# Patient Record
Sex: Female | Born: 1935 | Race: White | Hispanic: No | Smoking: Never smoker
Health system: Southern US, Community
[De-identification: ages and names within clinical notes are randomized; demographics above are authoritative.]

## PROBLEM LIST (undated history)

## (undated) DIAGNOSIS — Z789 Other specified health status: Secondary | ICD-10-CM

## (undated) DIAGNOSIS — E042 Nontoxic multinodular goiter: Secondary | ICD-10-CM

## (undated) DIAGNOSIS — E785 Hyperlipidemia, unspecified: Secondary | ICD-10-CM

## (undated) DIAGNOSIS — N189 Chronic kidney disease, unspecified: Secondary | ICD-10-CM

## (undated) DIAGNOSIS — K649 Unspecified hemorrhoids: Secondary | ICD-10-CM

## (undated) DIAGNOSIS — B029 Zoster without complications: Secondary | ICD-10-CM

## (undated) DIAGNOSIS — S6990XA Unspecified injury of unspecified wrist, hand and finger(s), initial encounter: Secondary | ICD-10-CM

## (undated) DIAGNOSIS — R0602 Shortness of breath: Secondary | ICD-10-CM

## (undated) DIAGNOSIS — E041 Nontoxic single thyroid nodule: Secondary | ICD-10-CM

## (undated) DIAGNOSIS — I2510 Atherosclerotic heart disease of native coronary artery without angina pectoris: Secondary | ICD-10-CM

## (undated) DIAGNOSIS — Z8619 Personal history of other infectious and parasitic diseases: Secondary | ICD-10-CM

## (undated) DIAGNOSIS — Z01810 Encounter for preprocedural cardiovascular examination: Secondary | ICD-10-CM

## (undated) DIAGNOSIS — R58 Hemorrhage, not elsewhere classified: Secondary | ICD-10-CM

## (undated) DIAGNOSIS — C801 Malignant (primary) neoplasm, unspecified: Secondary | ICD-10-CM

## (undated) DIAGNOSIS — I10 Essential (primary) hypertension: Secondary | ICD-10-CM

## (undated) DIAGNOSIS — S4990XA Unspecified injury of shoulder and upper arm, unspecified arm, initial encounter: Secondary | ICD-10-CM

## (undated) DIAGNOSIS — R001 Bradycardia, unspecified: Secondary | ICD-10-CM

## (undated) HISTORY — DX: Nontoxic multinodular goiter: E04.2

## (undated) HISTORY — DX: Chronic kidney disease, unspecified: N18.9

## (undated) HISTORY — DX: Shortness of breath: R06.02

## (undated) HISTORY — DX: Nontoxic single thyroid nodule: E04.1

## (undated) HISTORY — DX: Encounter for preprocedural cardiovascular examination: Z01.810

## (undated) HISTORY — DX: Essential (primary) hypertension: I10

## (undated) HISTORY — DX: Hyperlipidemia, unspecified: E78.5

## (undated) HISTORY — DX: Malignant (primary) neoplasm, unspecified: C80.1

## (undated) HISTORY — DX: Atherosclerotic heart disease of native coronary artery without angina pectoris: I25.10

## (undated) HISTORY — DX: Bradycardia, unspecified: R00.1

## (undated) HISTORY — PX: OTHER SURGICAL HISTORY: SHX169

## (undated) HISTORY — DX: Personal history of other infectious and parasitic diseases: Z86.19

## (undated) HISTORY — DX: Hemorrhage, not elsewhere classified: R58

## (undated) HISTORY — DX: Other specified health status: Z78.9

---
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
## 1972-03-20 HISTORY — PX: ABDOMINAL HYSTERECTOMY: SHX81

## 1979-03-21 DIAGNOSIS — K649 Unspecified hemorrhoids: Secondary | ICD-10-CM

## 1979-03-21 HISTORY — DX: Unspecified hemorrhoids: K64.9

## 1993-03-20 HISTORY — PX: TIBIA FRACTURE SURGERY: SHX806

## 2002-08-22 ENCOUNTER — Inpatient Hospital Stay (HOSPITAL_COMMUNITY): Admission: EM | Admit: 2002-08-22 | Discharge: 2002-08-26 | Payer: Self-pay | Admitting: Vascular Surgery

## 2002-08-22 ENCOUNTER — Encounter: Payer: Self-pay | Admitting: Vascular Surgery

## 2002-08-23 ENCOUNTER — Encounter: Payer: Self-pay | Admitting: Vascular Surgery

## 2003-01-23 ENCOUNTER — Encounter: Payer: Self-pay | Admitting: Cardiology

## 2003-01-29 ENCOUNTER — Ambulatory Visit (HOSPITAL_COMMUNITY): Admission: RE | Admit: 2003-01-29 | Discharge: 2003-01-29 | Payer: Self-pay | Admitting: Gastroenterology

## 2003-02-06 ENCOUNTER — Ambulatory Visit (HOSPITAL_COMMUNITY): Admission: RE | Admit: 2003-02-06 | Discharge: 2003-02-06 | Payer: Self-pay | Admitting: Cardiology

## 2003-03-21 HISTORY — PX: WRIST FUSION WITH ILIAC CREST BONE GRAFT: SHX5682

## 2003-05-28 ENCOUNTER — Ambulatory Visit (HOSPITAL_BASED_OUTPATIENT_CLINIC_OR_DEPARTMENT_OTHER): Admission: RE | Admit: 2003-05-28 | Discharge: 2003-05-28 | Payer: Self-pay | Admitting: Orthopedic Surgery

## 2007-02-07 ENCOUNTER — Emergency Department (HOSPITAL_COMMUNITY): Admission: EM | Admit: 2007-02-07 | Discharge: 2007-02-07 | Payer: Self-pay | Admitting: Emergency Medicine

## 2007-03-05 ENCOUNTER — Emergency Department (HOSPITAL_COMMUNITY): Admission: EM | Admit: 2007-03-05 | Discharge: 2007-03-06 | Payer: Self-pay | Admitting: Emergency Medicine

## 2007-03-21 DIAGNOSIS — Z9861 Coronary angioplasty status: Secondary | ICD-10-CM

## 2007-03-21 DIAGNOSIS — I2510 Atherosclerotic heart disease of native coronary artery without angina pectoris: Secondary | ICD-10-CM

## 2007-03-21 HISTORY — DX: Atherosclerotic heart disease of native coronary artery without angina pectoris: I25.10

## 2007-04-03 ENCOUNTER — Inpatient Hospital Stay (HOSPITAL_COMMUNITY): Admission: EM | Admit: 2007-04-03 | Discharge: 2007-04-06 | Payer: Self-pay | Admitting: Emergency Medicine

## 2007-04-03 ENCOUNTER — Ambulatory Visit: Payer: Self-pay | Admitting: Cardiology

## 2007-04-05 ENCOUNTER — Encounter (INDEPENDENT_AMBULATORY_CARE_PROVIDER_SITE_OTHER): Payer: Self-pay | Admitting: Emergency Medicine

## 2008-04-23 ENCOUNTER — Ambulatory Visit: Payer: Self-pay | Admitting: Cardiology

## 2008-11-10 ENCOUNTER — Encounter: Payer: Self-pay | Admitting: Cardiology

## 2008-11-11 ENCOUNTER — Ambulatory Visit: Payer: Self-pay | Admitting: Cardiology

## 2009-05-21 ENCOUNTER — Telehealth: Payer: Self-pay | Admitting: Cardiology

## 2009-11-15 ENCOUNTER — Encounter: Payer: Self-pay | Admitting: Cardiology

## 2009-11-16 ENCOUNTER — Ambulatory Visit: Payer: Self-pay | Admitting: Cardiology

## 2010-04-09 ENCOUNTER — Encounter: Payer: Self-pay | Admitting: Gastroenterology

## 2010-04-11 ENCOUNTER — Telehealth: Payer: Self-pay | Admitting: Cardiology

## 2010-04-21 NOTE — Progress Notes (Signed)
Summary: question on meds  Medications Added METOPROLOL SUCCINATE 25 MG XR24H-TAB (METOPROLOL SUCCINATE) 1 tab per day       Phone Note Call from Patient Call back at [facility] Phone [phone]   Caller: Patient Reason for Call: Talk to Nurse Summary of Call: pt has question re METOPROLOL SUCCINATE 25 MG. Initial call taken by: Roe Coombs,  April 11, 2010 11:13 AM  Follow-up for Phone Call        Called patient back and she advised me that she increased her Metoprolol XR to 25mg  every day because her BP had increased. She is now getting a BP reading of 125/66 to 125/79. Will send in a refill to [facility].  Layne Benton, RN, BSN  April 11, 2010 11:39 AM     New/Updated Medications: METOPROLOL SUCCINATE 25 MG XR24H-TAB (METOPROLOL SUCCINATE) 1 tab per day Prescriptions: METOPROLOL SUCCINATE 25 MG XR24H-TAB (METOPROLOL SUCCINATE) 1 tab per day  #90 x 3   Entered by:   Layne Benton, RN, BSN   Authorized by:   Talitha Givens, MD, [facility]   Signed by:   Layne Benton, RN, BSN on 04/11/2010   Method used:   Electronically to        [facility] [address] (retail)       [address]       [place], [place]  [address]       Ph: [phone] or [phone]       Fax: [phone]   RxID:   [id number]   Appended Document: question on meds Good

## 2010-04-21 NOTE — Assessment & Plan Note (Signed)
Summary: fu 1 year/jt  Medications Added ASPIRIN 81 MG TBEC (ASPIRIN) Take 1 tablet by mouth two times a day METOPROLOL SUCCINATE 25 MG XR24H-TAB (METOPROLOL SUCCINATE) 1/2 tab once daily MAGNESIUM 300 MG CAPS (MAGNESIUM) 3 tabs daily VITAMIN D 2000 UNIT TABS (CHOLECALCIFEROL) Take 1 tablet by mouth once a day * ULTRA JUICE Take 1 tablet by mouth once a day CINNAMON 500 MG CAPS (CINNAMON) 2 tabs daily * OTC POTASSIUM Take 1 tablet by mouth once a day CO Q-10 150 MG CAPS (COENZYME Q10) Take 1 tablet by mouth once a day GARLIQUE 400 MG TBEC (GARLIC) Take 1 tablet by mouth once a day BENADRYL 25 MG CAPS (DIPHENHYDRAMINE HCL) as needed ALEVE 220 MG TABS (NAPROXEN SODIUM) as needed DICYCLOMINE HCL 10 MG CAPS (DICYCLOMINE HCL) as needed      Allergies Added:   Visit Type:  Follow-up Primary Provider:  Abner Greenspan, MD   History of Present Illness: The patient is seen for followup of coronary artery disease.  She feels great.  She is not having any chest pain.  She has no shortness of breath.  She has some eye surgery planned.  She is cleared for this.  Current Medications (verified): 1)  Aspirin 81 Mg Tbec (Aspirin) .... Take 1 Tablet By Mouth Two Times A Day 2)  Metoprolol Succinate 25 Mg Xr24h-Tab (Metoprolol Succinate) .... 1/2 Tab Once Daily 3)  Estropipate 0.75 Mg Tabs (Estropipate) .... M,w,f 4)  Multivitamins   Tabs (Multiple Vitamin) .... Take One Tablet By Mouth Once Daily. 5)  Glucosamine 500 Mg Caps (Glucosamine Sulfate) .... Uad 6)  Calcium 1500 Mg Tabs (Calcium Carbonate) .... Daily 7)  Magnesium 300 Mg Caps (Magnesium) .... 3 Tabs Daily 8)  Vitamin D 2000 Unit Tabs (Cholecalciferol) .... Take 1 Tablet By Mouth Once A Day 9)  Fish Oil   Oil (Fish Oil) .... Take One Tablet By Mouth Once Daily. 10)  Ultra Juice .... Take 1 Tablet By Mouth Once A Day 11)  Cinnamon 500 Mg Caps (Cinnamon) .... 2 Tabs Daily 12)  Otc Potassium .... Take 1 Tablet By Mouth Once A Day 13)  Co Q-10  150 Mg Caps (Coenzyme Q10) .... Take 1 Tablet By Mouth Once A Day 14)  Garlique 400 Mg Tbec (Garlic) .... Take 1 Tablet By Mouth Once A Day 15)  Benadryl 25 Mg Caps (Diphenhydramine Hcl) .... As Needed 16)  Aleve 220 Mg Tabs (Naproxen Sodium) .... As Needed 17)  Dicyclomine Hcl 10 Mg Caps (Dicyclomine Hcl) .... As Needed  Allergies (verified): 1)  ! * Synthroid 2)  ! * Levothyroxine 3)  ! Penicillin 4)  ! * Tetnus 5)  ! * Percdan 6)  ! Tetracycline 7)  ! * Oxycontin 8)  ! Vicodin 9)  ! * Codine 10)  ! Darvocet 11)  ! Keflex 12)  ! Cipro 13)  ! Cephalexin 14)  ! Clindamycin 15)  ! * Nitrofur 16)  ! Darvon 17)  ! * Serzone 18)  ! * Proocardia  Past History:  Past Medical History: CAD   DES to LAD.Marland KitchenMarland Kitchen1/2009  /   Stress Echo Claris Gower)  09/2007  EFnormal...no ischemia Thyroid   samll heterogenious nodules.Marland KitchenMarland KitchenOK Retroperitoneal bleed  (post cath 2004)  surgery Drug allergies Hypertension Dyslipidemia  Review of Systems       Patient denies fever, chills vomiting, sweats, rash, change in vision, change in hearing, chest pain, cough, nausea vomiting, urinary symptoms.  All of the systems are reviewed and  are negative.  Vital Signs:  Patient profile:   77 year old female Weight:      166 pounds Pulse rate:   50 / minute BP sitting:   138 / 76  (left arm)  Vitals Entered By: Meredith Staggers, RN (November 16, 2009 7:53 AM)  Physical Exam  General:  patient looks great. Eyes:  no xanthelasma. Neck:  no jugular venous distention. Lungs:  lungs her respiratory effort is nonlabored. Heart:  cardiac exam reveals an S1-S2.  No clicks or significant murmurs. Abdomen:  abdomen soft. Extremities:  no peripheral edema. Psych:  patient is oriented to person time and place.  Affect is normal.   Impression & Recommendations:  Problem # 1:  DYSLIPIDEMIA (ICD-272.4) Patient has not tolerated statins in the past.  At this time she prefers not to use one.  Problem # 2:  HYPERTENSION  (ICD-401.9)  Her updated medication list for this problem includes:    Aspirin 81 Mg Tbec (Aspirin) .Marland Kitchen... Take 1 tablet by mouth two times a day    Metoprolol Succinate 25 Mg Xr24h-tab (Metoprolol succinate) .Marland Kitchen... 1/2 tab once daily Blood pressure is controlled.  No change in therapy.  Problem # 3:  CAD (ICD-414.00)  Her updated medication list for this problem includes:    Aspirin 81 Mg Tbec (Aspirin) .Marland Kitchen... Take 1 tablet by mouth two times a day    Metoprolol Succinate 25 Mg Xr24h-tab (Metoprolol succinate) .Marland Kitchen... 1/2 tab once daily Coronary disease is stable.  EKG is done and reviewed by me.  Old ST changes.  No change in the past.  No testing needed.  Follow up one year.  Orders: EKG w/ Interpretation (93000)  Patient Instructions: 1)  Your physician wants you to follow-up in:  1 year.  You will receive a reminder letter in the mail two months in advance. If you don't receive a letter, please call our office to schedule the follow-up appointment.

## 2010-04-21 NOTE — Miscellaneous (Signed)
  Clinical Lists Changes  Problems: Removed problem of UNSPECIFIED HYPOTHYROIDISM (ICD-244.9) Added new problem of HYPOTHYROIDISM (ICD-244.9) Added new problem of HYPERTENSION (ICD-401.9) Added new problem of DYSLIPIDEMIA (ICD-272.4) Observations: Added new observation of PAST MED HX: CAD   DES to LAD.Marland KitchenMarland Kitchen1/2009  /   Stress Echo Claris Gower)  09/2007  EFnormal...no ischemia Thyroid   samll heterogenious nodules.Marland KitchenMarland KitchenOK Retroperitoneal bleed  (post cath 2004)  surgery Drug allergies Hypertension Dyslipidemia (11/15/2009 8:39) Added new observation of PRIMARY MD: Joetta Manners, MD (11/15/2009 8:39)       Past History:  Past Medical History: CAD   DES to LAD.Marland KitchenMarland Kitchen1/2009  /   Stress Echo Claris Gower)  09/2007  EFnormal...no ischemia Thyroid   samll heterogenious nodules.Marland KitchenMarland KitchenOK Retroperitoneal bleed  (post cath 2004)  surgery Drug allergies Hypertension Dyslipidemia

## 2010-04-21 NOTE — Progress Notes (Signed)
Summary: regarding toprol   Phone Note Call from Patient Call back at Home Phone [phone] Call back at [phone]   Caller: Patient Reason for Call: Talk to Nurse Details for Reason: Per pt calling toprol.  Initial call taken by: Lorne Skeens,  May 21, 2009 2:44 PM  Follow-up for Phone Call        spoke w/pt she has been taking metoprolol tartrate 25mg  1/2 tab two times a day for a long time and now when she got it refilled it is met. succ she wants to use what she has so it doesn't cost her so much, she will take met. succ. 25mg  1/2 tab daily until she runs out then she will call me back for new rx for met tart., if she notices feeling different or elevated BP or HR will call back Meredith Staggers, RN  May 21, 2009 3:28 PM

## 2010-05-19 DIAGNOSIS — R0602 Shortness of breath: Secondary | ICD-10-CM | POA: Insufficient documentation

## 2010-05-19 DIAGNOSIS — R001 Bradycardia, unspecified: Secondary | ICD-10-CM | POA: Insufficient documentation

## 2010-05-19 HISTORY — DX: Shortness of breath: R06.02

## 2010-05-19 HISTORY — DX: Bradycardia, unspecified: R00.1

## 2010-06-02 ENCOUNTER — Telehealth: Payer: Self-pay | Admitting: Cardiology

## 2010-06-06 ENCOUNTER — Other Ambulatory Visit: Payer: Self-pay | Admitting: Cardiology

## 2010-06-06 ENCOUNTER — Ambulatory Visit (INDEPENDENT_AMBULATORY_CARE_PROVIDER_SITE_OTHER): Payer: Medicare Other | Admitting: Cardiology

## 2010-06-06 ENCOUNTER — Encounter: Payer: Self-pay | Admitting: Cardiology

## 2010-06-06 ENCOUNTER — Ambulatory Visit (INDEPENDENT_AMBULATORY_CARE_PROVIDER_SITE_OTHER)
Admission: RE | Admit: 2010-06-06 | Discharge: 2010-06-06 | Disposition: A | Discharge status: Home / Self Care | Payer: Medicare Other | Source: Ambulatory Visit | Attending: Cardiology | Admitting: Cardiology

## 2010-06-06 DIAGNOSIS — I10 Essential (primary) hypertension: Secondary | ICD-10-CM

## 2010-06-06 DIAGNOSIS — R0602 Shortness of breath: Secondary | ICD-10-CM

## 2010-06-06 LAB — CBC WITH DIFFERENTIAL/PLATELET
Basophils Absolute: 0.1 10*3/uL (ref 0.0–0.1)
Basophils Relative: 1.3 % (ref 0.0–3.0)
Eosinophils Absolute: 0.8 10*3/uL — ABNORMAL HIGH (ref 0.0–0.7)
Eosinophils Relative: 9 % — ABNORMAL HIGH (ref 0.0–5.0)
HCT: 38.4 % (ref 36.0–46.0)
Hemoglobin: 13 g/dL (ref 12.0–15.0)
Lymphocytes Relative: 34.1 % (ref 12.0–46.0)
Lymphs Abs: 3 10*3/uL (ref 0.7–4.0)
MCHC: 33.8 g/dL (ref 30.0–36.0)
MCV: 100.6 fl — ABNORMAL HIGH (ref 78.0–100.0)
Monocytes Absolute: 0.7 10*3/uL (ref 0.1–1.0)
Monocytes Relative: 8.4 % (ref 3.0–12.0)
Neutro Abs: 4.1 10*3/uL (ref 1.4–7.7)
Neutrophils Relative %: 47.2 % (ref 43.0–77.0)
Platelets: 205 10*3/uL (ref 150.0–400.0)
RBC: 3.82 Mil/uL — ABNORMAL LOW (ref 3.87–5.11)
RDW: 13.1 % (ref 11.5–14.6)
WBC: 8.8 10*3/uL (ref 4.5–10.5)

## 2010-06-06 LAB — BASIC METABOLIC PANEL
BUN: 18 mg/dL (ref 6–23)
CO2: 27 mEq/L (ref 19–32)
Calcium: 9.4 mg/dL (ref 8.4–10.5)
Chloride: 101 mEq/L (ref 96–112)
Creatinine, Ser: 1.5 mg/dL — ABNORMAL HIGH (ref 0.4–1.2)
GFR: 36.33 mL/min — ABNORMAL LOW (ref >60.00)
Glucose, Bld: 91 mg/dL (ref 70–99)
Potassium: 4.6 mEq/L (ref 3.5–5.1)
Sodium: 137 mEq/L (ref 135–145)

## 2010-06-06 LAB — TSH: TSH: 3.24 u[IU]/mL (ref 0.35–5.50)

## 2010-06-07 NOTE — Progress Notes (Signed)
Summary: pt is SOB   Phone Note Call from Patient Call back at [phone]   Caller: Patient Reason for Call: Talk to Nurse, Talk to Doctor Summary of Call: pt is SOB and wants to come in to see Dr. Myrtis Ser on Monday or asap/lg Initial call taken by: Omer Jack,  June 02, 2010 12:15 PM  Follow-up for Phone Call        Since Nov pt has noticed increased SOB, cont. to get worse and has SOB w/any activity, yest. she had an episode of lightheadedness, BP today 127/69, no chest pain or discomfortant, she has found that she is having to stop half way through acitivities to rest and catch her breathe, appt sch for Mon 3/19 at 12 w/Dr Boykin Nearing, RN  June 02, 2010 2:11 PM

## 2010-06-08 ENCOUNTER — Telehealth: Payer: Self-pay | Admitting: Cardiology

## 2010-06-08 NOTE — Telephone Encounter (Signed)
Pt given lab and xray results

## 2010-06-13 ENCOUNTER — Encounter: Payer: Self-pay | Admitting: Cardiology

## 2010-06-13 ENCOUNTER — Telehealth: Payer: Self-pay | Admitting: Cardiology

## 2010-06-13 NOTE — Telephone Encounter (Signed)
LMTCB--NT 

## 2010-06-13 NOTE — Telephone Encounter (Signed)
Herbert Seta,  What is this all about?

## 2010-06-13 NOTE — Telephone Encounter (Signed)
Pt calling stating no longer SOB since change in BP med--would like to know if needs ECHO and stress test--nt

## 2010-06-14 ENCOUNTER — Telehealth: Payer: Self-pay | Admitting: Cardiology

## 2010-06-14 NOTE — Telephone Encounter (Signed)
Pt aware, appts cx'd she will c/b if syptoms return

## 2010-06-14 NOTE — Telephone Encounter (Signed)
It appears that her labs were good.  Her x-ray did not show any significant findings.   Since she is feeling much better it is okay to not proceed with the other testing.  I can plan to see her back in 6 months for followup

## 2010-06-14 NOTE — Telephone Encounter (Signed)
Spoke w/pt, documented in phone mess dated 3/26

## 2010-06-14 NOTE — Telephone Encounter (Signed)
Left message to call back  

## 2010-06-14 NOTE — Telephone Encounter (Signed)
We saw pt on 3/19 and she was having bradycardia and SOB so we stopped metoprolol and you ordered an echo and stress echo, she states now SOB is better wants to know if she should have testing or not

## 2010-06-16 ENCOUNTER — Other Ambulatory Visit (HOSPITAL_COMMUNITY): Payer: Medicare Other | Admitting: Radiology

## 2010-06-16 ENCOUNTER — Other Ambulatory Visit (HOSPITAL_COMMUNITY): Payer: Medicare Other

## 2010-06-16 NOTE — Assessment & Plan Note (Signed)
Summary: increased SOB/hms  Medications Added VITAMIN B COMPLEX-C   CAPS (B COMPLEX-C) once daily OCUVITE  TABS (MULTIPLE VITAMINS-MINERALS) once daily ALEVE 220 MG TABS (NAPROXEN SODIUM) as needed      Allergies Added:   Visit Type:  add on for shortness of breath Primary Provider:  Abner Greenspan, MD  CC:  CAD.  History of Present Illness: The patient has known or near artery disease.  She received a rug eluting stent in January, 2009.  She had a stress echo out of town in July, 2009.  There was no ischemia.  Over several months she has noted exertional shortness of breath.  There is no chest pain.  She has no PND or orthopnea.  She did have chest discomfort originally when her stent was placed.  She also has had some borderline presyncope.  She has not had syncope.  Current Medications (verified): 1)  Aspirin 81 Mg Tbec (Aspirin) .... Take 1 Tablet By Mouth Two Times A Day 2)  Metoprolol Succinate 25 Mg Xr24h-Tab (Metoprolol Succinate) .Marland Kitchen.. 1 Tab Per Day 3)  Estropipate 0.75 Mg Tabs (Estropipate) .... M,w,f 4)  Multivitamins   Tabs (Multiple Vitamin) .... Take One Tablet By Mouth Once Daily. 5)  Glucosamine 500 Mg Caps (Glucosamine Sulfate) .... Uad 6)  Calcium 1500 Mg Tabs (Calcium Carbonate) .... Daily 7)  Magnesium 300 Mg Caps (Magnesium) .... 3 Tabs Daily 8)  Vitamin D 2000 Unit Tabs (Cholecalciferol) .... Take 1 Tablet By Mouth Once A Day 9)  Fish Oil   Oil (Fish Oil) .... Take One Tablet By Mouth Once Daily. 10)  Ultra Juice .... Take 1 Tablet By Mouth Once A Day 11)  Otc Potassium .... Take 1 Tablet By Mouth Once A Day 12)  Co Q-10 150 Mg Caps (Coenzyme Q10) .... Take 1 Tablet By Mouth Once A Day 13)  Benadryl 25 Mg Caps (Diphenhydramine Hcl) .... As Needed 14)  Aleve 220 Mg Tabs (Naproxen Sodium) .... As Needed 15)  Dicyclomine Hcl 10 Mg Caps (Dicyclomine Hcl) .... As Needed 16)  Vitamin B Complex-C   Caps (B Complex-C) .... Once Daily 17)  Ocuvite  Tabs (Multiple  Vitamins-Minerals) .... Once Daily 18)  Aleve 220 Mg Tabs (Naproxen Sodium) .... As Needed  Allergies (verified): 1)  ! * Synthroid 2)  ! * Levothyroxine 3)  ! Penicillin 4)  ! * Tetnus 5)  ! * Percdan 6)  ! Tetracycline 7)  ! * Oxycontin 8)  ! Vicodin 9)  ! * Codine 10)  ! Darvocet 11)  ! Keflex 12)  ! Cipro 13)  ! Cephalexin 14)  ! Clindamycin 15)  ! * Nitrofur 16)  ! Darvon 17)  ! * Serzone 18)  ! * Proocardia  Past History:  Past Medical History: CAD   DES to LAD.Marland KitchenMarland Kitchen1/2009  /   Stress Echo Claris Gower)  09/2007  EFnormal...no ischemia Thyroid   samll heterogenious nodules.Marland KitchenMarland KitchenOK Retroperitoneal bleed  (post cath 2004)  surgery Drug allergies Hypertension Dyslipidemia Bradycardia    March, 2012 Shortness of breath    March, 2012  Review of Systems       Patient denies fever, chills, headache, sweats, rash, change in vision, change in hearing, chest pain, cough, nausea vomiting, urinary symptoms.  All other systems are reviewed and are negative.  Vital Signs:  Patient profile:   77 year old female Height:      64 inches Weight:      167 pounds BMI:  28.77 Pulse rate:   65 / minute BP sitting:   150 / 78  (left arm) Cuff size:   regular  Vitals Entered By: Hardin Negus, RMA (June 06, 2010 12:23 PM)  Physical Exam  General:  patient looked stable today.  She is overweight. Head:  head is atraumatic. Eyes:  no xanthelasma. Neck:  no jugular venous extension. Chest Wall:  no chest wall tenderness. Lungs:  lungs are clear.  Respiratory effort is nonlabored. Heart:  cardiac exam reveals S1 and S2.  No clicks or significant murmurs. Abdomen:  abdomen is soft. Msk:  no musculoskeletal deformities Extremities:  no peripheral edema. Skin:  no skin rash. Psych:  patient is oriented to person time and place.  Affect is normal.   Impression & Recommendations:  Problem # 1:  BRADYCARDIA (ICD-427.89) The patient's heart rate today is 48.  It is possible that  bradycardia is playing a role with her symptoms.  She is on a small dose of metoprolol that will be stopped.  Problem # 2:  DYSLIPIDEMIA (ICD-272.4) The patient has not tolerated statins in the past and prefers not to use one.  Problem # 3:  HYPERTENSION (ICD-401.9)  The following medications were removed from the medication list:    Metoprolol Succinate 25 Mg Xr24h-tab (Metoprolol succinate) .Marland Kitchen... 1 tab per day Her updated medication list for this problem includes:    Aspirin 81 Mg Tbec (Aspirin) .Marland Kitchen... Take 1 tablet by mouth two times a day Blood pressure is slightly elevated today.  No change in therapy until we understand her symptoms better.  Problem # 4:  CAD (ICD-414.00)  The following medications were removed from the medication list:    Metoprolol Succinate 25 Mg Xr24h-tab (Metoprolol succinate) .Marland Kitchen... 1 tab per day Her updated medication list for this problem includes:    Aspirin 81 Mg Tbec (Aspirin) .Marland Kitchen... Take 1 tablet by mouth two times a day The patient does have coronary disease.  EKG is done today and reviewed by me.  There is no QRS changes.  She has old ST changes.  There is marked bradycardia.  It is possible that her shortness of breath he is an anginal equivalent but I'm not convinced.  We will need to rule this out.  Stress echo will be done.  Problem # 5:  SHORTNESS OF BREATH (ICD-786.05)  This is the major complaint today and it is new.  She is not volume overloaded.  It is possible that this could be from ischemia.  With her bradycardia however she may have chronotropic incompetence.  We also need to see if she had any lab work.  She will have a CBC, chemistry lab, TSH, chest x-ray, and a stress echo with her walking on the treadmill.  It will be important to document her O2 sats when she walks and to document her heart rate response.  We will be able to obtain ECHO data at the same time to see if there's been a change in her LV function.  This will be done with standard  echo.  Orders: TLB-BMP (Basic Metabolic Panel-BMET) (80048-METABOL) TLB-CBC Platelet - w/Differential (85025-CBCD) TLB-TSH (Thyroid Stimulating Hormone) (84443-TSH) Echocardiogram (Echo) Stress Echo (Stress Echo) T-2 View CXR (71020TC)  Patient Instructions: 1)  Stop your Metoprolol for now' 2)  Lab today 3)  A chest x-ray takes a picture of the organs and structures inside the chest, including the heart, lungs, and blood vessels. This test can show several things, including, whether the heart  is enlarged; whether fluid is building up in the lungs; and whether pacemaker / defibrillator leads are still in place. 4)  Your physician has requested that you have a stress echocardiogram. For further information please visit [email or web address]  Please follow instruction sheet as given. 5)  Your physician has requested that you have an echocardiogram.  Echocardiography is a painless test that uses sound waves to create images of your heart. It provides your doctor with information about the size and shape of your heart and how well your heart's chambers and valves are working.  This procedure takes approximately one hour. There are no restrictions for this procedure. 6)  Follow up in 2-3 weeks

## 2010-06-20 ENCOUNTER — Ambulatory Visit: Payer: Medicare Other | Admitting: Cardiology

## 2010-07-19 DIAGNOSIS — B029 Zoster without complications: Secondary | ICD-10-CM

## 2010-07-19 HISTORY — DX: Zoster without complications: B02.9

## 2010-08-02 NOTE — Discharge Summary (Signed)
NAMEADALYNN, CORNE                ACCOUNT NO.:  [id number]   MEDICAL RECORD NO.:  [id number]          PATIENT TYPE:  INP   LOCATION:  6532                         FACILITY:  [facility]   PHYSICIAN:  Elliot Cousin, M.D.    DATE OF BIRTH:  02-18-1928   DATE OF ADMISSION:  04/03/2007  DATE OF DISCHARGE:  04/06/2007                               DISCHARGE SUMMARY   DISCHARGE DIAGNOSES:  1. Chest pain/angina.  Myocardial infarction ruled out.  2. Coronary artery disease.  The cardiac catheterization as performed      by Dr. Tonny Bollman revealed single-vessel CAD with moderately      severe mid LAD stenosis; diffuse nonobstructive left circumflex and      right coronary artery stenosis; normal left ventricular filling      pressures; successful PCI of the LAD with an Endeavor drug-eluting      stent on April 05, 2007.  3. Hypertension.  4. Acute-on-chronic kidney disease.  5. Hyperlipidemia.   DISCHARGE MEDICATIONS:  1. Aspirin 325 mg daily.  2. Plavix 75 mg daily.  3. Sublingual nitroglycerin as needed.  4. Lipitor 20 mg q.h.s.  5. Metoprolol 25 mg 1/2 a tablet b.i.d.  6. Hydrochlorothiazide 25 mg 1/2 a tablet b.i.d.  7. Bentyl as previously prescribed.  8. Lisinopril 10 mg daily.  9. Potassium chloride 20 mEq 1/2 a tablet daily.  10.Estrogen, currently being weaned by the patient's gynecologist.  11.Multivitamin once daily.  12.Calcium once daily.   DISCHARGE DISPOSITION:  The patient was discharged home in improved and  stable condition on April 06, 2007.  She was advised to follow up with  her cardiologist, Dr. Myrtis Ser, in 1-2 weeks and with her primary care  physician in 1-2 weeks.   CONSULTATIONS:  [facility], including Dr. Diona Browner, Dr. Excell Seltzer,  and Dr. Daleen Squibb.   PROCEDURES PERFORMED:  1. Cardiac catheterization on April 05, 2007.  The results are      above.  2. A 2-D echocardiogram on April 05, 2007.  The results revealed an      ejection fraction of 55%  to 65%.  No left ventricular regional wall      motion abnormalities.  Mild AR, mildly dilated left atrium, mildly      dilated right atrium.  3. Chest x-ray on April 03, 2007.  The results revealed no active      disease.   HISTORY OF PRESENT ILLNESS:  The patient is a 77 year old woman with a  past medical history significant for coronary artery disease,  hypertension, and diverticulitis.  She presented to the emergency  department on April 03, 2007, with a chief complaint of substernal  chest pressure.  Her signs and symptoms were worrisome for angina.  She  was therefore admitted for further evaluation and management.   For additional details, please see the dictated history and physical.   HOSPITAL COURSE:  1. ANGINA/CORONARY ARTERY DISEASE.  At the time of the initial      assessment, the patient was hemodynamically stable.  Her EKG      revealed sinus bradycardia with heart  rate of 51 beats per minute      and nonspecific ST/T-wave abnormalities.  Her initial cardiac      markers were negative.  The patient was started on treatment      empirically with full- dose Lovenox and nitroglycerin..  In      addition, she was started on Lopressor and hydrochlorothiazide for      additional blood pressure control.  Aspirin at 325 mg was continued      daily.  For further evaluation, cardiac enzymes, a fasting lipid      panel, and a 2-D echocardiogram were ordered.  The cardiac enzymes      were negative.  Her fasting lipid panel revealed a total      cholesterol of 205, triglycerides of 94, HDL cholesterol of 36, and      LDL cholesterol of 150.  She was started on Lipitor 20 mg daily.      The 2-D echocardiogram revealed preserved LV function with an      ejection fraction of 55% to 65%.  Her TSH was assessed as well and      was marginally elevated at 7.4.   Cardiologist, Dr. Diona Browner, was consulted.  Per his evaluation, the  patient needed a cardiac catheterization.  The  cardiac catheterization  was performed on April 05, 2007, by Dr. Excell Seltzer.  In essence, the  cardiac catheterization revealed severe mid LAD  stenosis.  The patient  underwent successful PCI of the LAD followed by a drug-eluting stent.  The patient was subsequently started on Plavix.   Over the course of the hospitalization, the patient became chest pain  free.  The nitroglycerin and Lovenox were eventually discontinued.  The  patient was advised to continue medical management as recommended.  She  did voice concern about taking Lipitor as she has had some intolerances  to statin drugs in the past.  The patient states that she probably will  not take Lipitor and will continue lifestyle changes.  In addition, the  patient was advised to discontinue estrogen therapy.  She stated that  her gynecologist is currently weaning her off of estrogen.   1. ACUTE ON CHRONIC KIDNEY DISEASE.  The lisinopril was temporarily      withheld during the initial part of the hospitalization.  The      patient's creatinine was 1.4 on admission and 1.3 prior to hospital      discharge.  She was advised to resume lisinopril following hospital      discharge.   1. MILDLY ELEVATED TSH.  The patient will need to have a free T4      ordered in the outpatient setting.  This will be deferred to Dr.      Myrtis Ser or her primary care physician.      Elliot Cousin, M.D.  Electronically Signed     DF/MEDQ  D:  04/06/2007  T:  04/06/2007  Job:  [id number]   cc:   Luis Abed, MD, [facility]  Aundra Dubin. Revankar, M.D.  Huel Cote, M.D.  [facility]

## 2010-08-02 NOTE — Consult Note (Signed)
[name], [name] NO.:  [id number]   MEDICAL RECORD NO.:  [id number]          PATIENT TYPE:  INP   LOCATION:  4711                         FACILITY:  [facility]   PHYSICIAN:  Jonelle Sidle, MD DATE OF BIRTH:  01/16/1928   DATE OF CONSULTATION:  DATE OF DISCHARGE:                                 CONSULTATION   PRIMARY CARDIOLOGIST:  Luis Abed, MD, [facility].   REQUESTING SERVICE:  [facility] E Team.   REASON FOR CONSULTATION:  Chest tightness and abnormal  electrocardiogram.   HISTORY OF PRESENT ILLNESS:  Vanessa [name] is a pleasant 77 year old woman  with a history of hypertension that was fairly recently diagnosed back  in November 2008, possible diverticulitis recently treated with Cipro  and with plans for a colonoscopy by Dr. Charm Barges next week due to a mild  degree of hematochezia noted on tissue and a family history of colon  cancer and no clear history of obstructive cardiovascular disease.  Available information indicates that she underwent a cardiac  catheterization by Dr. Tomie China in [place] back in June 2004, following  an abnormal myocardial perfusion study.  This study reportedly revealed  no obstructive coronary artery disease, although she ultimately  developed a significant retroperitoneal bleed and was transferred  urgently to [facility] for intervention by Dr. Hart Rochester.  The  patient recuperated well.  She saw Dr. Myrtis Ser in 2004, although has had no  followup cardiac evaluation since that time.  She was referred for a  ventilation/perfusion lung scan and had no clear evidence of  thromboembolic disease at that time.   More recently over the last few months, she has been experiencing some  abdominal pain, predominantly in the left lower quadrant and was seen at  [facility] in November at which time an abdominal and pelvic  CT scan was performed.  This revealed no acute abdominal findings and  evidence of prior hysterectomy  with post surgical changes in the right  inguinal region.  She has been recently on Cipro for presumed  diverticulitis and is to undergo further gastroenterology evaluation as  already documented.   She is presently admitted to the hospital having developed a feeling of  chest heaviness when she woke up in the morning yesterday.  These  symptoms waxed and waned and the patient states that they were at least  moderate in intensity, moving up into her chest from the epigastric area  initially.  She was worried that she may be having a heart  attack,although she was also concerned that she may have been having an  allergic reaction to the Cipro.  She presented to the hospital for  further assessment.  At the present time she is comfortable, having been  treated with both morphine and intravenous nitroglycerin.  She notes  improvement in her symptoms after starting on nitroglycerin.  Her  electrocardiogram is abnormal but relatively nonspecific showing  nonspecific ST-T wave changes.  Cardiac markers have been normal with  troponin I level of 0.02, down to less than 0.01 and normal CK-MB  levels.  She does  have an elevated LDL cholesterol of 150.  A chest x-  ray demonstrated no active cardiopulmonary disease.  She was tentatively  scheduled for a followup Myoview and also echocardiogram by the  [facility] team although the patient refuses to have this done, citing a  significant amount of discharge and problems with the previous Myoview  she had in 2004.  We have been asked to evaluate her now.   ALLERGIES:  The patient reports an allergy to:  1. PENICILLIN.  2. PERCODAN.  3. TETRACYCLINE.  4. VICODIN.  5. OXYCODONE.  6. Possibly a reaction to Cipro although this is not well documented.   PRESENT MEDICATIONS:  1. Aspirin 325 mg p.o. daily.  2. Bentyl 20 mg p.o. q.i.d.  3. Lovenox 70 mg subcutaneously q.12 h.  4. Hydrochlorothiazide 25 mg p.o. daily.  5. Lopressor 12.5 mg p.o.  b.i.d.  6. Protonix 40 mg p.o. daily.  7. Senokot 1 tablet q.h.s.  8. Zocor 20 mg p.o. q.h.s.  9. Ventolin.  10.Atrovent.  11.Nitroglycerin drip.  12.Zofran p.r.n.  13.Phenergan p.r.n.   PAST MEDICAL HISTORY:  Outlined above.  The patient reports that  generally she has been fairly healthy despite these issues.  She has a  previous history of appendectomy, tonsillectomy, hysterectomy, and  hemorrhoidectomy.  She has had surgery on her left leg, forearm, and  wrist as well.   SOCIAL HISTORY:  The patient is a retired Engineer, water,  previously worked at [employer].  Denies any  tobacco or alcohol use.  She lives in [place].   FAMILY HISTORY:  Reviewed.  The patient states that her mother died at  age 72 with a myocardial infarction.  Father died of old age.  No  siblings with premature cardiovascular disease.   REVIEW OF SYSTEMS:  As described in the history of present illness.  She  has no typical exertional chest pain.  She has had some lightheadedness  recently.  No frank claudication.  She does have occasional reflux  symptoms.  Otherwise systems are negative.   PHYSICAL EXAMINATION:  VITAL SIGNS:  Temperature is 97.7 degrees, heart  rate 58, respirations 18, blood pressure 133/58, oxygen saturation is  98% on room air.  GENERAL:  This is a well nourished woman, comfortable and in no acute  distress.  HEENT:  Conjunctivae and lids are normal.  Pharynx is clear.  NECK:  Supple.  No elevated jugular venous pressure .  No loud bruits.  No thyromegaly.  LUNGS:  Clear without labored breathing.  CARDIAC:  Reveals a regular rate and rhythm.  No loud murmur or gallop.  ABDOMEN;  Soft, nontender.  No guarding or rebound.  Bowel sounds are  present.  No obvious hepatomegaly is noted.  EXTREMITIES:  Exhibit no significant pitting edema.  Distal pulses are 2  plus.  SKIN:  Warm and dry.  MUSCULOSKELETAL:  No kyphosis noted.  NEUROPSYCHIATRIC:  The  patient is alert and oriented x3.  Affect is  normal.   LABORATORY DATA:  WBC 8.9, hemoglobin 12.6, hematocrit 37, platelet  count 225.  INR is 0.9.  D-dimer 0.35.  Sodium 138, potassium 3.8,  chloride 108, bicarb 27, glucose 102, BUN 16, creatinine 1.3.  Urinalysis normal.   IMPRESSION:  1. Recent onset chest heaviness at rest.  Responsive ultimately to      nitroglycerin and morphine and associated with normal cardiac      markers.  Electrocardiogram is abnormal but nonspecific.  This is  on a baseline of hyperlipidemia and recently diagnosed      hypertension, although previously documented evidence of no major      obstructive cardiovascular disease by cardiac catheterization in      2004.  The patient denies any interval followup ischemic testing.      Initially a Myoview was ordered by the admitting service, although      the patient does not want to proceed with this citing problems with      previous stress testing and significant apprehension about this.  2. Reportedly minor hematochezia noted recently with a normal      hemoglobin of 12.6 and a normal MCV of 98.  The patient is being      followed by Dr. Charm Barges and there were actually plans underway for a      colonoscopy next week given a family history of colon cancer.  The      patient has also had some left lower quadrant pain and possibly      diverticulitis.  She had an abdominal and pelvic CT scan in      November demonstrating no acute findings.  3. Previous history of large intraperitoneal bleed requiring surgical      intervention by Dr. Hart Rochester following a cardiac catheterization done      in [place] by Dr. Tomie China in 2004.  The patient has recovered      well without any obvious sequelae.   RECOMMENDATIONS:  I had a lengthy discussion with the patient.  She has  had recent resting chest heaviness concerning for potential unstable  angina, although her cardiac markers are reassuring and her   electrocardiogram is nonspecifically abnormal.  She has not undergone  any ischemic evaluation since 2004 and does have risk factors including  post menopausal state, hyperlipidemia, and recently diagnosed  hypertension.  In light of this and the fact that she is in need of a  followup colonoscopy as discussed above, I do think that a followup  cardiac evaluation is warranted.  I discussed this with the patient and  we reviewed the options of either treating her for presumptive  progression in cardiovascular disease with medical therapy alone,  proceeding onto a noninvasive Myoview, or proceed on to a repeat  diagnostic cardiac catheterization, realizing the potential risks and  benefits including her prior history of retroperitoneal hematoma.  After  carefully considering the matter, the patient does not want to proceed  with observation alone and also does not want to proceed with a Myoview  given significant apprehension about this.  In point of fact, she is  much more inclined to undergo a followup diagnostic cardiac  catheterization.  We reviewed the potential risks and benefits and this  will be scheduled for tomorrow.  She has normal bilateral femoral pulses  with scar on the right following her previous surgery.  Both groins will  be prepped.  In the meanwhile, would suggest continuing aspirin,  Lovenox, nitroglycerin, beta-blocker therapy, and Statin therapy.  Further plans to follow.      Jonelle Sidle, MD  Electronically Signed     SGM/MEDQ  D:  04/04/2007  T:  04/04/2007  Job:  [id number]   cc:   Luis Abed, MD, [facility]

## 2010-08-02 NOTE — Cardiovascular Report (Signed)
NAMETAKARA, Vanessa [name]                ACCOUNT NO.:  [id number]   MEDICAL RECORD NO.:  [id number]          PATIENT TYPE:  INP   LOCATION:  6532                         FACILITY:  [facility]   PHYSICIAN:  Veverly Fells. Excell Seltzer, MD  DATE OF BIRTH:  1927/08/09   DATE OF PROCEDURE:  04/05/2007  DATE OF DISCHARGE:                            CARDIAC CATHETERIZATION   PROCEDURE:  Left heart catheterization, selective coronary angiography  and stenting of the LAD.   INDICATIONS:  [name] is a 77 year old woman who presented with  substernal chest pain.  She has known previous CAD, but she has been  managed medically, as she has only had mild-to-moderate stenosis by  report.  She presented with substernal chest pressure in the setting of  multiple risk factors.  She was referred for cardiac catheterization for  further evaluation.   Risks and indications of the procedure were reviewed with the patient,  and informed consent was obtained.  The left groin was prepped, draped,  and anesthetized with 1% lidocaine.  Using a modified Seldinger  technique, a 6-French sheath was placed in the left femoral artery.  Standard 6-French Judkins catheters were used to image the coronary  arteries.  The JR-4 catheter was used to cross the aortic valve and  perform pullback.   At the completion of the diagnostic procedure I elected to intervene on  the LAD.  There was a focal stenosis of 75-80% in the midportion of the  LAD with a hazy appearance.  A CLS 3.5 cm guide catheter was used.  Angiomax was used for anticoagulation.  The patient was preloaded with  600 mg of clopidogrel on the table.  A cougar guidewire was passed  beyond the area of disease into the distal LAD.  Once a therapeutic ACT  was achieved, the lesion was primarily stented with a 2.5 x 12-mm  endeavor stent which was deployed a 12 atmospheres.  The stent was well  expanded.  Following stenting, I elected to post dilate with a 2.5 x 9-  mm   sprinter noncompliant balloon.  This was taken to 15 atmospheres.   At the completion of procedure the stent was well expanded.  There is  TIMI 3 flow throughout the vessel.  There was a step-down from the  distal end of the stent, but it was not associated with significant  stenosis.  Following stenting, the patient developed chest and arm  pressure.  I carefully reviewed the films, and there were no side  branches that were compromised.  There was TIMI 3 flow throughout the  vessel.  It is possible that she had microembolization.  She required  0.5 mg of atropine for a vagal reaction.  With fluids and atropine, she  became hemodynamically stable within a matter of just a few minutes.   FINDINGS:  1. Left ventricular pressure 124/8, aortic pressure 123/53 with a mean      of 81.  2. The left mainstem has mild diffuse disease creating no more than      20% stenosis.  The left main bifurcates into the LAD and  left      circumflex.  3. The LAD courses down the anterior wall and reaches the LV apex.      The proximal portion of the LAD has minor plaque.  The midportion      around the areas of the first and second septal perforators have      mild disease with approximately 30% stenosis.  There is a focal      area beyond the second diagonal branch with a 75% hazy appearing      stenosis.  Beyond that area, the vessel becomes rather small and      bifurcates into a diagonal and distal LAD.   The left circumflex is diffusely diseased.  There is nonobstructive  plaque throughout.  It supplies a tiny OM-1 and a moderate-sized OM-2  branch.  There are no focal stenoses present.  The tightest lesion is in  the range of 30-40%.   The right coronary artery has very minor disease.  It is a big vessel  that supplies the inferior wall.  There is diffuse plaque throughout the  proximal and midportions.  Distally it terminates in a PDA branch and  posterolateral branch.  Both branches are widely  patent with no  significant stenosis.   ASSESSMENT:  1. Single-vessel CAD with moderately severe mid-LAD stenosis.  2. Diffuse nonobstructive left circumflex and right coronary artery      stenosis.  3. Normal left ventricular filling pressures.  4. Successful PCI of the LAD with an endeavor drug-eluting stent.   RECOMMEND:  Indefinite aspirin with 12 months minimum of Plavix.  Ms.  Bradt will be watched carefully, but overall she is she is on currently  stable.      Veverly Fells. Excell Seltzer, MD  Electronically Signed     MDC/MEDQ  D:  04/05/2007  T:  04/05/2007  Job:  [id number]   cc:   Luis Abed, MD, [facility]  Huel Cote, M.D.  Webb Silversmith

## 2010-08-02 NOTE — H&P (Signed)
NAME:  Vanessa [name], Vanessa [name]                ACCOUNT NO.:  [id number]   MEDICAL RECORD NO.:  [id number]          PATIENT TYPE:  INP   LOCATION:  4711                         FACILITY:  [facility]   PHYSICIAN:  Mobolaji B. Bakare, M.D.DATE OF BIRTH:  03-25-1927   DATE OF ADMISSION:  04/03/2007  DATE OF DISCHARGE:                              HISTORY & PHYSICAL   PRIMARY CARE PHYSICIAN:  None.   CARDIOLOGIST:  Aundra Dubin. Revankar, MD--[place].   GYNECOLOGIST:  Huel Cote, MD   GASTROENTEROLOGIST:  Webb Silversmith, MD--[place].   CHIEF COMPLAINT:  Substernal chest pressure.   HISTORY OF PRESENT ILLNESS:  Vanessa [name] is a 77 year old Caucasian female  with history of hypertension.  She woke up this morning about 6 a.m.  having substernal chest pressure.  It was not associated with shortness  of breath, diaphoresis, nausea, or vomiting.  It was nonradiating.  The  patient went to Urgent Care in [place].  She tells me that her EKG was  said to be abnormal.  She was sent over to the emergency room here.  EKG  done today shows nonspecific T-wave abnormalities in inferolateral  leads.  I noted on EKG done on March 05, 2007,  ST segment  abnormality in the lateral leads.  This appears to have improved with  this current EKG.  She was seen on March 04, 2007 in the emergency  room with a chief complaint of elevated blood pressure and back pain,  and heart fluttering.   The patient has history of CAD.  She was evaluated with a cardiac  catheterization in 2004.  She stated that she was seen by a cardiologist  up in [name] at that time with some chest complaint.  She  underwent a stress test. On completion of the stress test she had left-  sided chest pain radiating to  her arm.  She was taken for an emergency  cardiac catheterization.  She stated that she was told she has a 30/70  blockage, but could not give further details.  This cardiac  catheterization was complicated by hematoma  and she was transferred over  to [name] for emergency surgery.   The patient has been quite active at home; has never had angina  symptoms.  She was recently diagnosed with hypertension in November 2008  and was started on lisinopril.   The patient was seen by gastroenterologist 3 days ago for left lower  quadrant pain and she was started on treatment for presumed  diverticulitis with ciprofloxacin and dicyclomine.  She is scheduled to  have colonoscopy next week, Tuesday.   REVIEW OF SYSTEMS:  She denies fever, cough, shortness of breath,  dyspnea on exertion, lower extremity edema, orthopnea, or PND.  She  denies dysuria or urgency.   PAST MEDICAL HISTORY:  1. Hypertension, recently diagnosed.  2. Coronary artery disease in 2004.  This was not really followed up.  3. On treatment for diverticulitis.   PAST SURGICAL HISTORY:  1. Appendectomy in 1949.  2. Tonsillectomy in 1956.  3. Hysterectomy in 1974.  4. Hemorrhoidectomy in 1981.  5.  Left leg surgery with rods in 1995 for broken bone.  6. Left forearm surgery in 2007 by Dr. Amanda Pea with removal of plates      in November 2008.  7. Left wrist and hand surgery in 2005.   ALLERGIES:  PENICILLIN causes a rash.  TETANUS causes a welt.  TETRACYCLINE causes a rash.  OXYCONTIN, VICODIN, TYLENOL WITH CODEINE,  NAPROXEN, she developed severe diarrhea to CLINDAMYCIN.  MEPERGAN,  KEFLEX, CEPHALEXIN causes swollen face and redness.  TRAMADOL causes  stomach pains.   FAMILY HISTORY:  Mother passed away from myocardial infarction at the  age of 43.  Father passed away from old age.  Two sisters died from  complications of asthma.  One sister died from colon cancer, and sister  passed away from lung cancer, she was a smoker.   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  She does not smoke  cigarettes, nor drink alcohol.   PHYSICAL EXAMINATION:  INITIAL  VITALS:  Temperature 97.9.  Blood  pressure 168/80.  Pulse of 70, respiratory rate of 18.  O2 sat of 95%.  On examination she is awake, alert, oriented in time, place, and person.  Normocephalic, atraumatic head.  Pupils are equal, round, and reactive  to light.  Extraocular muscle movement intact.  Mucous membranes moist.  No oral thrush.  No elevated JVD.  No carotid bruit.  LUNGS:  Clear clinically to auscultation.  CARDIOVASCULAR:  S1,  S2, regular.  ABDOMEN:  Not distended.  Soft.  Mild left lower quadrant tenderness  without rebound or guarding.  No palpable organomegaly.  CHEST WALL:  Minimal reproducible chest tenderness.  EXTREMITIES:  No pedal edema or calf tenderness.  Dorsalis pedis pulses  palpable bilaterally.  CNS:  No focal neurological deficits.   INITIAL LABORATORY DATA:  Cardiac makers at the point of care, myoglobin  82, troponin less than 0.05.  CK MB less than 1.  Phosphatidyl cardiac  marker was also normal.  PT/INR 12.5/0.9.  Creatinine 1.4, sodium 134,  potassium 4.1, chloride 108, glucose 101, BUN 20,  Hemoglobin 16.3, hematocrit 48, bicarb 27.  Chest x-ray shows no acute  cardiopulmonary disease.   ASSESSMENT AND PLAN:  1. Vanessa [name] is a 77 year old Caucasian female with history of      hypertension and coronary artery disease.  She is presenting with      substernal chest pressure.  She had an abnormal EKG on March 05, 2007.  Now with nonspecific ST abnormality on a current EKG.      Cardiac makers at the point of care normal.  Chest pressure is      responding to nitroglycerine infusion, started by emergency room      physician.  The patient will be admitted to telemetry floor and      rule out myocardial infarction.  Obtain a D-dimer, cardiac enzymes      and q.8 h. x3 sets, check fasting lipid profile and hemoglobin A1c.      Start Lopressor 12.5 mg p.o. b.i.d., aspirin 325 mg daily (she was      given aspirin in the emergency room).  Continue  nitroglycerin      infusion.  Start full dose Lovenox and will check stool hemoccults      daily for 3 days.  The patient will need at least a stress test if      she  rules out.  2. Hypertension.  Will start her on Lopressor and hydrochlorothiazide.      I am holding lisinopril in view of the elevated creatinine.  We may      be able to resume lisinopril if creatinine normalizes.  3. Acute on chronic kidney disease.  Check urinalysis, hold      lisinopril.  IV fluid normal saline at 60 mL per hour.  Will repeat      BMET in the morning.  4. Presumed diverticulitis.  Will continue ciprofloxacin 500 mg b.i.d.      and dicyclomine 20 mg p.o. q.i.d. p.r.n.  The patient has a      scheduled appointment for colonoscopy with Dr. Charm Barges on Tuesday,      April 17, 2007.  5. Will obtain record from [facility].      Mobolaji B. Corky Downs, M.D.  Electronically Signed     MBB/MEDQ  D:  04/03/2007  T:  04/04/2007  Job:  [id number]   cc:   Aundra Dubin. Revankar, M.D.  Huel Cote, M.D.  Webb Silversmith

## 2010-08-02 NOTE — Assessment & Plan Note (Signed)
[facility]                            CARDIOLOGY OFFICE NOTE   Vanessa [name], Vanessa [name]                       MRN:          [id number]  DATE:04/23/2008                            DOB:          1927-07-10    Ms. Vanessa [name] is here to reestablish cardiac care.  We had taken care for in  the past.  Also, she was admitted for cardiac evaluation in January  2009.  After that time, she moved to [place], [place] to be closer  to family, but now has moved back and wants to reestablish with her.  In  January, we actually saw her in the hospital, but not in the office.  In  the hospital, she had chest pain.  She had some nonspecific ST-T wave  changes.  Cardiac catheterization was done on April 05, 2007.  It  showed a severe mid LAD stenosis.  The patient received a drug-eluting  stent to that area and was started on Plavix.  Since that time, she has  recovered completely.  She has been followed by the cardiologist in  [place].  She has not had anymore of the chest discomfort that she had  previously.  Currently, she is not having any chest pain.  She is going  about full activities.   PAST MEDICAL HISTORY:   ALLERGIES:  PENICILLIN, PERCODAN, TETRACYCLINE, VICODIN, CIPRO, TETANUS,  OXYCODONE, CODEINE, TRAMADOL, DARVON, KEFLEX, CLINDAMYCIN,  NITROFURANTOIN, and a question of LEVOTHYROXINE.   MEDICATIONS:  Aspirin, vitamins, calcium, omega-3 fish oil, magnesium,  metoprolol, and estropipate.   OTHER MEDICAL PROBLEMS:  See the list below.   REVIEW OF SYSTEMS:  She is not having any GI or GU symptoms.  She has no  headaches.  There are no fevers or chills.  She has no skin rashes.  She  is wearing a brace on her right ankle.  She had a fractured ankle in  November and it continues to heal slowly, but it still has to be braced.  Otherwise, her review of systems is negative.   PHYSICAL EXAMINATION:  VITAL SIGNS:  Blood pressure is 175/83 with a  pulse of  52.  GENERAL:  The patient is oriented to person, time, and place.  Affect is  normal.  HEENT:  No xanthelasma.  She has normal extraocular motion.  NECK:  There are no carotid bruits.  There is no jugular venous  distention.  LUNGS:  Clear.  Respiratory effort is not labored.  CARDIAC:  S1 with an S2.  There are no clicks or significant murmurs.  ABDOMEN:  Soft.  EXTREMITIES:  She has no peripheral edema.  She has a brace on her right  ankle.   EKG reveals sinus bradycardia with nonspecific ST-T wave changes.  The  patient had a stress echo done in [place] on October 14, 2007.  She  exercised 5 minutes.  There were no significant EKG changes.  Wall  motion at rest was normal.  Wall motion immediately post stress revealed  no significant abnormalities.  She had LVH with a normal ejection  fraction.  There was  trace aortic regurgitation.  There was decreased  tolerance overall, but no evidence of ischemia.  The patient had a  thyroid scan on July 18, 2007, in [place].  It was felt that she had  a heterogeneous gland with a few small nodules.  I will be talking to  the patient about whether recommendation had been made for followup.  I  spoke to the patient.  She tells me that she did have testing and was  eventually told that her thyroid was okay.  This issue will have to be  kept in mind over time by a primary care physician when she establishes  with new primary care back here in [place].   PROBLEMS:  #1.  History of catheterization in 2004.  At that time, her  coronaries showed no major occlusion, but she had a large  retroperitoneal bleed that had to be repaired surgically.  #2.  Coronary artery disease with an intervention with a drug-eluting  stent in January 2009.  This was an Endeavor stent drug-eluting placed  to the mid circumflex.  Stress echo done in [place] in July showed no  ischemia.  #3.  Recent fractured ankle, which is improving.  She did not have   syncope.  #4.  Multiple drug allergies, as listed above.  #5.  Hypertension.  Blood pressure is elevated today.  She has run short  on her medicines.  These will be restarted.  She will be seen back for  blood pressure followup.  #6.  Status post appendectomy and hysterectomy.  #7.  Elevated cholesterol.  Currently, she is not listed on a statin.  We will discuss this with her.  Historically, she has not tolerated  medications well.   Cardiac status is stable.  We will give her medications.  We will see  her back to follow up the issues as outlined above.  I have spent a  prolonged period of time reviewing 30 pages of outpatient data and an  old chart and reviewing all of her current situation.     Luis Abed, MD, [facility]  Electronically Signed    JDK/MedQ  DD: 04/23/2008  DT: 04/24/2008  Job #: [id number]

## 2010-08-05 NOTE — Op Note (Signed)
NAME:  Vanessa [name], Vanessa [name]                          ACCOUNT NO.:  [id number]   MEDICAL RECORD NO.:  [id number]                   PATIENT TYPE:  AMB   LOCATION:  DSC                                  FACILITY:  [facility]   PHYSICIAN:  Dionne Ano. Everlene Other, M.D.         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   PROCEDURE:  1. Scaphotrapezoid trapezium fusion with distal radius bone graft and K-wire     fixation.  2. Attainment of bone graft from the left distal radius.  3. Placement of MIG synthetic bone graft in the distal radius.  4. Stress radiography.  5. Partial radial styloidectomy, left wrist.   SURGEON:  Dionne Ano. Amanda Pea, M.D.   ASSISTANT:  Karie Chimera, P.A.-C.   COMPLICATIONS:  None.   ANESTHESIA:  General LMA.   DRAINS:  None.   TOURNIQUET TIME:  Less than 90 minutes.   INDICATIONS FOR PROCEDURE:  This patient is a 77 year old female who  presents with the above mentioned diagnosis.  I have counseled her in  regards to the risks and benefits of surgery including the risks of  infection, bleeding, anesthesia, damage to normal structures, and failure of  surgery to accomplish its intended goals.  With this in mind, she desires to  proceed.  All questions were encouraged and answered preoperatively.   OPERATIVE FINDINGS:  The patient had severe arthritis.  She underwent STT  fusion as described by Claudette Laws with associated styloidectomy.  She tolerated  the procedure well without complicating features.   PROCEDURE IN DETAIL:  The patient was taken to the operating suite, permit  was signed, preoperative antibiotics were given  in the form of vancomycin.  She was prepped and draped in the usual sterile fashion after LMA general  anesthesia was induced.  Once under general anesthesia, the patient had the  arm elevated, the tourniquet was elevated to 250 mmHg.  The patient then  underwent a transverse incision just distal to the radial styloid dorsal  radially.  Crossing veins were protected.  The patient had the radial artery  identified and mobilized.  I then identified the STT joint, incised this  longitudinally, and following this prepared the trapezoid, trapezium, and  scaphoid with dental rongeur.  Once the cancellous surfaces were denuded  appropriately, I then preplaced two 0.045 K-wires in the trapezoid.  I then  placed an approximately 3-5 mm instrument in the area, reduced the scaphoid  appropriately, placed the wrist in 45 degrees of extension and in radial  deviation and pinned the scaphoid.  This produced the desired position as  described by Claudette Laws.  Following  this, I turned attention towards the distal  radius.  An incision was made, transverse in nature, dissection was carried  down between the ECRL and ECRB.  A pilot hole was made oval in nature and  cancellous bone graft was harvested to from the distal radius to my  satisfaction without difficulty.  The patient tolerated the procedure well,  there were no complicating features with this.  Once this was harvested,  this bone graft was taken and placed in the STT joint.  It was packed nicely  and checked under radiograph.  Final copy x-rays were made.  I then placed  MIG bone graft from [facility] which is a calcium sulfate mixture in the  void created.  Due to the fact that she is 77 years of age, I did not want  to leave this hollow and, thus, placed bone graft in the form of the calcium  sulfate in this defect.  The cortex was not encroached upon nor was there an  iatrogenic fracture.  Following this, I turned attention towards the radial   styloid.  This was exposed without difficulty taking care to protect the  superficial radial nerve.  At this point in time, I performed a partial  styloidectomy.  This was approximately 6-7 mm, taking care to avoid injury  to the radial scaphocapitate ligament.  She tolerated this well, this was  smooth, bone wax was placed against it, and following this, the tourniquet  was deflated.  Hemostasis was obtained with bipolar electrocautery as it was  drained throughout the course of the procedure.  The deep branch of the  radial artery was identified, once again, and was patent and looked quite  well.  I then copiously irrigated the wounds and closed them with  interrupted Prolene.  The patient then had a long arm cast with the thumb  placed in mobilization of desired fashion and the MCP joint mobilized in  flexion.  She tolerated this well, there were no complicating features, all  sponge, needle, and instrument counts were reported as correct.  We will  monitor her condition in the recovery room.  She will spend the night for IV  antibiotics, elevation, pain control, etc.  I have discussed with the  family.  Marcaine without epinephrine was placed in the wound for postop  analgesia.  A small amount of Gelfoam with epinephrine soaked Marcaine was  placed in the distal radial styloid area.  There were no complicating  features with her surgery.  We will monitor her closely and proceed  accordingly.                                               Dionne Ano. Everlene Other, M.D.    Nash Mantis  D:  05/28/2003  T:  05/29/2003  Job:  [id number]

## 2010-09-12 ENCOUNTER — Encounter: Payer: Self-pay | Admitting: Cardiology

## 2010-10-19 ENCOUNTER — Encounter: Payer: Self-pay | Admitting: Cardiology

## 2010-10-27 ENCOUNTER — Encounter: Payer: Self-pay | Admitting: Cardiology

## 2010-10-27 DIAGNOSIS — E042 Nontoxic multinodular goiter: Secondary | ICD-10-CM | POA: Insufficient documentation

## 2010-10-27 DIAGNOSIS — K683 Retroperitoneal hematoma: Secondary | ICD-10-CM | POA: Insufficient documentation

## 2010-10-27 DIAGNOSIS — I10 Essential (primary) hypertension: Secondary | ICD-10-CM | POA: Insufficient documentation

## 2010-10-27 DIAGNOSIS — E785 Hyperlipidemia, unspecified: Secondary | ICD-10-CM | POA: Insufficient documentation

## 2010-10-27 DIAGNOSIS — R58 Hemorrhage, not elsewhere classified: Secondary | ICD-10-CM | POA: Insufficient documentation

## 2010-10-28 ENCOUNTER — Encounter: Payer: Self-pay | Admitting: Cardiology

## 2010-10-28 ENCOUNTER — Ambulatory Visit (INDEPENDENT_AMBULATORY_CARE_PROVIDER_SITE_OTHER): Payer: Medicare Other | Admitting: Cardiology

## 2010-10-28 VITALS — BP 182/83 | HR 51 | Ht 64.0 in | Wt 155.0 lb

## 2010-10-28 DIAGNOSIS — I10 Essential (primary) hypertension: Secondary | ICD-10-CM

## 2010-10-28 DIAGNOSIS — R0602 Shortness of breath: Secondary | ICD-10-CM

## 2010-10-28 DIAGNOSIS — Z888 Allergy status to other drugs, medicaments and biological substances status: Secondary | ICD-10-CM

## 2010-10-28 DIAGNOSIS — I498 Other specified cardiac arrhythmias: Secondary | ICD-10-CM

## 2010-10-28 DIAGNOSIS — R001 Bradycardia, unspecified: Secondary | ICD-10-CM

## 2010-10-28 DIAGNOSIS — I2510 Atherosclerotic heart disease of native coronary artery without angina pectoris: Secondary | ICD-10-CM

## 2010-10-28 DIAGNOSIS — Z789 Other specified health status: Secondary | ICD-10-CM

## 2010-10-28 DIAGNOSIS — E785 Hyperlipidemia, unspecified: Secondary | ICD-10-CM

## 2010-10-28 NOTE — Assessment & Plan Note (Signed)
Heart rate today is 51.  She is not having symptoms.  No change in therapy.

## 2010-10-28 NOTE — Assessment & Plan Note (Signed)
Systolic blood pressure is elevated today.  She takes her blood pressure regularly at home and her systolic is never above 130.  I believe there is a white coat component to her blood pressure today.  She is anxious.  No further workup.

## 2010-10-28 NOTE — Patient Instructions (Signed)
Your physician recommends that you schedule a follow-up appointment in: 12 months, the office will mail you a reminder letter 2 months prior appointment date. Your physician recommends that you continue on your current medications as directed. Please refer to the Current Medication list given to you today. 

## 2010-10-28 NOTE — Assessment & Plan Note (Signed)
The patient's coronary disease is stable.  She is on aspirin.  She is not having any significant symptoms.  Her EKG is unchanged.

## 2010-10-28 NOTE — Progress Notes (Signed)
HPI The patient is seen for cardiology followup.  I had seen her in March, 2012.  At that time she had shortness of breath.  Laboratory studies were good and her chest x-ray showed no marked abnormality.  I had recommended a stress echo.  The patient felt better within a few days and asked if it was okay not to proceed with the echo.  The study was not done.  Since that time she's done well.  As part of today's evaluation I have reviewed the prior record and updated it to the current EMR.  I have also reviewed several years of EKGs.  The patient does not have any significant chest pain or shortness of breath. Allergies  Allergen Reactions  . Cephalexin   . Ciprofloxacin   . Clindamycin   . Codeine   . Hydrocodone-Acetaminophen   . Levothyroxine Sodium   . Oxycodone Hcl   . Penicillins   . Propoxyphene Hcl   . Propoxyphene N-Acetaminophen   . Tetanus Toxoid   . Tetracycline     Current Outpatient Prescriptions  Medication Sig Dispense Refill  . aspirin 81 MG tablet Take 81 mg by mouth 2 (two) times daily.        . B Complex-C (B-COMPLEX WITH VITAMIN C) tablet Take 1 tablet by mouth daily.        . calcium citrate-vitamin D (CITRACAL+D) 315-200 MG-UNIT per tablet Take 1 tablet by mouth daily.        . Cholecalciferol (VITAMIN D) 2000 UNITS CAPS Take 1 capsule by mouth daily.        Marland Kitchen dicyclomine (BENTYL) 10 MG capsule Take 10 mg by mouth as needed.        . diphenhydrAMINE (BENADRYL) 25 MG tablet as needed.        Marland Kitchen estropipate (OGEN) 0.75 MG tablet Take 0.75 mg by mouth. M, W, F       . Fish Oil OIL Take 1 tablet by mouth daily.        . Flaxseed, Linseed, (FLAX SEED OIL PO) Take by mouth daily.        . Glucosamine 500 MG CAPS Take by mouth. UAD       . Magnesium 300 MG CAPS Take 3 capsules by mouth daily.        . Multiple Vitamin (MULTIVITAMIN) capsule Take 1 capsule by mouth daily.        . naproxen sodium (ANAPROX) 220 MG tablet as needed.        Marland Kitchen POTASSIUM PO Take 1 tablet  by mouth daily.          History   Social History  . Marital Status: Widowed    Spouse Name: N/A    Number of Children: N/A  . Years of Education: N/A   Occupational History  . Retired    Social History Main Topics  . Smoking status: Never Smoker   . Smokeless tobacco: Not on file  . Alcohol Use: No  . Drug Use: No  . Sexually Active: Not on file   Other Topics Concern  . Not on file   Social History Narrative  . No narrative on file    Family History  Problem Relation Age of Onset  . Cancer    . Coronary artery disease      Past Medical History  Diagnosis Date  . CAD (coronary artery disease) 03/2007    DES to LAD, January, 2009 / stress echo, July, 2009 elsewhere, no ischemia  .  Multiple thyroid nodules     small heterogenious nodules...OK  . Retroperitoneal bleed     post cath 2004-surgery  . HTN (hypertension)   . Dyslipidemia   . Bradycardia 05/2010    March, 2012  . SOB (shortness of breath) 05/2010    March, 2012    No past surgical history on file.  ROS  Patient denies fever, chills, headache, sweats, rash, change in vision, change in hearing, chest pain, cough, nausea vomiting, urinary symptoms.  All other systems are reviewed and are negative.  PHYSICAL EXAM Patient is oriented to person time and place.  Affect is normal.  Head is atraumatic.  There is no xanthelasma.  Lungs are clear.  Respiratory effort is unlabored.  Cardiac exam reveals an S1-S2.  No clicks or significant murmurs.  The abdomen is soft there is no peripheral edema.  There are no musculoskeletal deformities.  There are no skin rashes. Filed Vitals:   10/28/10 0906  BP: 182/83  Pulse: 51  Height: 5\' 4"  (1.626 m)  Weight: 155 lb (70.308 kg)    EKG Is done today and reviewed by me.  I've also compared to several tracings from the past.  Past EKG for comparison was from August of 2011.  The patient has inverted T waves in leads 2, 3 aVF and V3 to V6. This is  unchanged.  ASSESSMENT & PLAN

## 2010-10-28 NOTE — Assessment & Plan Note (Signed)
The patient's lipids are being managed by her primary physician.

## 2010-10-28 NOTE — Assessment & Plan Note (Signed)
Patient has statin intolerance from the past.  This is why she is not currently on one.

## 2010-10-28 NOTE — Assessment & Plan Note (Signed)
She is not having any shortness of breath.  No further workup.  I will see her for cardiology followup in one year.

## 2010-12-08 LAB — BASIC METABOLIC PANEL
CO2: 29
Calcium: 9.3
Chloride: 108
Creatinine, Ser: 1.33 — ABNORMAL HIGH
GFR calc Af Amer: 47 — ABNORMAL LOW
GFR calc Af Amer: 48 — ABNORMAL LOW
GFR calc non Af Amer: 39 — ABNORMAL LOW
Potassium: 3.8
Potassium: 3.8
Sodium: 138
Sodium: 140

## 2010-12-08 LAB — CBC
HCT: 37.8
HCT: 38.6
Hemoglobin: 12.6
Hemoglobin: 13.2
MCHC: 34.4
MCV: 99
MCV: 98.2
Platelets: 236
RBC: 3.82 — ABNORMAL LOW
RBC: 3.77 — ABNORMAL LOW
RBC: 3.9
WBC: 9.8
WBC: 8.9
WBC: 9.4

## 2010-12-08 LAB — URINALYSIS, ROUTINE W REFLEX MICROSCOPIC
Hgb urine dipstick: NEGATIVE
Nitrite: NEGATIVE
Specific Gravity, Urine: 1.011
Urobilinogen, UA: 0.2

## 2010-12-08 LAB — CARDIAC PANEL(CRET KIN+CKTOT+MB+TROPI)
CK, MB: 0.9
CK, MB: 1.2
CK, MB: 1.6
Relative Index: INVALID
Relative Index: INVALID
Total CK: 44
Total CK: 49
Troponin I: 0.01
Troponin I: 0.02
Troponin I: 0.03
Troponin I: 0.29 — ABNORMAL HIGH

## 2010-12-08 LAB — DIFFERENTIAL
Basophils Relative: 1
Lymphocytes Relative: 41
Lymphs Abs: 4
Monocytes Relative: 10
Neutro Abs: 3.7
Neutrophils Relative %: 38 — ABNORMAL LOW

## 2010-12-08 LAB — I-STAT 8, (EC8 V) (CONVERTED LAB)
Glucose, Bld: 101 — ABNORMAL HIGH
HCT: 48 — ABNORMAL HIGH
Hemoglobin: 16.3 — ABNORMAL HIGH
Operator id: 257131
Potassium: 4.1
Sodium: 138
TCO2: 28

## 2010-12-08 LAB — POCT I-STAT CREATININE: Operator id: 257131

## 2010-12-08 LAB — LIPID PANEL
Cholesterol: 205 — ABNORMAL HIGH
LDL Cholesterol: 150 — ABNORMAL HIGH
Total CHOL/HDL Ratio: 5.7

## 2010-12-08 LAB — COMPREHENSIVE METABOLIC PANEL
AST: 15
Albumin: 3.4 — ABNORMAL LOW
Alkaline Phosphatase: 59
Chloride: 105
GFR calc Af Amer: 45 — ABNORMAL LOW
Potassium: 3.9
Total Bilirubin: 0.7

## 2010-12-08 LAB — HEMOGLOBIN A1C: Hgb A1c MFr Bld: 5.8

## 2010-12-08 LAB — POCT CARDIAC MARKERS
CKMB, poc: 1.7
Myoglobin, poc: 86.1
Operator id: 257131

## 2010-12-08 LAB — TROPONIN I: Troponin I: 0.02

## 2010-12-23 LAB — POCT CARDIAC MARKERS
CKMB, poc: 1.1
Myoglobin, poc: 89.9
Operator id: 196461
Troponin i, poc: <0.05

## 2010-12-23 LAB — COMPREHENSIVE METABOLIC PANEL
Albumin: 3.6
Alkaline Phosphatase: 61
BUN: 16
Calcium: 9.8
Potassium: 3.6
Total Protein: 6.6

## 2010-12-23 LAB — CBC
HCT: 37.9
MCHC: 34.4
Platelets: 236
RDW: 13.2

## 2010-12-23 LAB — DIFFERENTIAL
Lymphocytes Relative: 45
Lymphs Abs: 4
Monocytes Absolute: 0.9
Monocytes Relative: 10
Neutro Abs: 3.5

## 2010-12-23 LAB — MAGNESIUM: Magnesium: 2.6 — ABNORMAL HIGH

## 2010-12-27 LAB — CBC
HCT: 39.8
Hemoglobin: 13.9
MCHC: 35
MCV: 96.3
Platelets: 276
RBC: 4.13
RDW: 12.9
WBC: 9.5

## 2010-12-27 LAB — URINALYSIS, ROUTINE W REFLEX MICROSCOPIC
Bilirubin Urine: NEGATIVE
Glucose, UA: NEGATIVE
Hgb urine dipstick: NEGATIVE
Ketones, ur: NEGATIVE
Nitrite: NEGATIVE
Protein, ur: NEGATIVE
Specific Gravity, Urine: 1.009
Urobilinogen, UA: 0.2
pH: 7

## 2010-12-27 LAB — COMPREHENSIVE METABOLIC PANEL
AST: 19
Albumin: 3.7
Alkaline Phosphatase: 66
Chloride: 103
GFR calc Af Amer: 41 — ABNORMAL LOW
Potassium: 4.1
Total Bilirubin: 0.7
Total Protein: 6.8

## 2010-12-27 LAB — DIFFERENTIAL
Basophils Absolute: 0.1
Basophils Relative: 1
Eosinophils Absolute: 0.4
Eosinophils Relative: 5
Lymphocytes Relative: 22
Lymphs Abs: 2.1
Monocytes Absolute: 0.7
Monocytes Relative: 7
Neutro Abs: 6.3
Neutrophils Relative %: 66

## 2010-12-27 LAB — COMPREHENSIVE METABOLIC PANEL WITH GFR
ALT: 17
BUN: 22
CO2: 28
Calcium: 9.8
Creatinine, Ser: 1.5 — ABNORMAL HIGH
GFR calc non Af Amer: 34 — ABNORMAL LOW
Glucose, Bld: 108 — ABNORMAL HIGH
Sodium: 141

## 2010-12-27 LAB — LIPASE, BLOOD: Lipase: 16

## 2011-03-21 DIAGNOSIS — S4990XA Unspecified injury of shoulder and upper arm, unspecified arm, initial encounter: Secondary | ICD-10-CM

## 2011-03-21 HISTORY — DX: Unspecified injury of shoulder and upper arm, unspecified arm, initial encounter: S49.90XA

## 2011-11-13 ENCOUNTER — Ambulatory Visit: Payer: Medicare Other | Admitting: Cardiology

## 2011-12-19 ENCOUNTER — Ambulatory Visit (INDEPENDENT_AMBULATORY_CARE_PROVIDER_SITE_OTHER): Payer: Medicare Other | Admitting: Cardiology

## 2011-12-19 ENCOUNTER — Encounter: Payer: Self-pay | Admitting: Cardiology

## 2011-12-19 VITALS — BP 138/90 | HR 50 | Ht 64.0 in | Wt 159.0 lb

## 2011-12-19 DIAGNOSIS — E785 Hyperlipidemia, unspecified: Secondary | ICD-10-CM

## 2011-12-19 DIAGNOSIS — R001 Bradycardia, unspecified: Secondary | ICD-10-CM

## 2011-12-19 DIAGNOSIS — Z01810 Encounter for preprocedural cardiovascular examination: Secondary | ICD-10-CM

## 2011-12-19 DIAGNOSIS — I498 Other specified cardiac arrhythmias: Secondary | ICD-10-CM

## 2011-12-19 DIAGNOSIS — I2510 Atherosclerotic heart disease of native coronary artery without angina pectoris: Secondary | ICD-10-CM

## 2011-12-19 DIAGNOSIS — I10 Essential (primary) hypertension: Secondary | ICD-10-CM

## 2011-12-19 LAB — BASIC METABOLIC PANEL
BUN: 26 mg/dL — ABNORMAL HIGH (ref 6–23)
CO2: 28 mEq/L (ref 19–32)
GFR: 39.59 mL/min — ABNORMAL LOW (ref >60.00)
Glucose, Bld: 94 mg/dL (ref 70–99)
Potassium: 4.1 mEq/L (ref 3.5–5.1)
Sodium: 138 mEq/L (ref 135–145)

## 2011-12-19 NOTE — Progress Notes (Signed)
HPI  Patient is seen for followup of coronary disease. She's also seen for preop clearance for shoulder surgery. The patient has known coronary disease. She's done very well. She received a drug-eluting stent January, 2009. Eventually she went off dual antiplatelet therapy. She's not having any chest pain or shortness of breath. She is fully active. She needs to have shoulder surgery.  She also mentions that she's been told that taking more potassium would be good for her. I explained that we should check her potassium and make sure that she is not taking too much over-the-counter potassium  Allergies  Allergen Reactions  . Cephalexin   . Ciprofloxacin   . Clindamycin   . Codeine   . Hydrocodone-Acetaminophen   . Levothyroxine Sodium   . Oxycodone Hcl   . Penicillins   . Propoxyphene Hcl   . Propoxyphene-Acetaminophen   . Tetanus Toxoid   . Tetracycline     Current Outpatient Prescriptions  Medication Sig Dispense Refill  . aspirin 81 MG tablet Take 81 mg by mouth 2 (two) times daily.        . B Complex-C (B-COMPLEX WITH VITAMIN C) tablet Take 1 tablet by mouth daily.        . calcium citrate-vitamin D (CITRACAL+D) 315-200 MG-UNIT per tablet Take 1 tablet by mouth daily.        . Cholecalciferol (VITAMIN D) 2000 UNITS CAPS Take 1 capsule by mouth daily.        Marland Kitchen dicyclomine (BENTYL) 10 MG capsule Take 10 mg by mouth as needed.        . diphenhydrAMINE (BENADRYL) 25 MG tablet as needed.        Marland Kitchen estropipate (OGEN) 0.75 MG tablet Take 0.75 mg by mouth. M, W, F       . Fish Oil OIL Take 1 tablet by mouth daily.        . Flaxseed, Linseed, (FLAX SEED OIL PO) Take by mouth daily.        . Glucosamine 500 MG CAPS Take by mouth. UAD       . Magnesium 300 MG CAPS Take 3 capsules by mouth daily.        . Multiple Vitamin (MULTIVITAMIN) capsule Take 1 capsule by mouth daily.        . naproxen sodium (ANAPROX) 220 MG tablet as needed.        Marland Kitchen POTASSIUM PO Take 1 tablet by mouth daily.           History   Social History  . Marital Status: Widowed    Spouse Name: N/A    Number of Children: N/A  . Years of Education: N/A   Occupational History  . Retired    Social History Main Topics  . Smoking status: Never Smoker   . Smokeless tobacco: Not on file  . Alcohol Use: No  . Drug Use: No  . Sexually Active: Not on file   Other Topics Concern  . Not on file   Social History Narrative  . No narrative on file    Family History  Problem Relation Age of Onset  . Cancer    . Coronary artery disease      Past Medical History  Diagnosis Date  . CAD (coronary artery disease) 03/2007    DES to LAD, January, 2009 / stress echo, July, 2009 elsewhere, no ischemia  . Multiple thyroid nodules     small heterogenious nodules...OK  . Retroperitoneal bleed  post cath 2004-surgery  . HTN (hypertension)   . Dyslipidemia   . Bradycardia 05/2010    March, 2012  . SOB (shortness of breath) 05/2010    March, 2012  . Statin intolerance   . Preop cardiovascular exam     Cardiac clearance for shoulder surgery October, 2013    No past surgical history on file.  ROS  Patient denies fever, chills, headache, sweats, rash, change in vision, change in hearing, chest pain, cough, nausea vomiting, urinary symptoms. All other systems are reviewed and are negative.  PHYSICAL EXAM  Patient is oriented to person time and place. Affect is normal. There is no jugulovenous distention. Lungs are clear. Respiratory effort is nonlabored. Cardiac exam reveals S1-S2. There no clicks or significant murmurs. The abdomen is soft. There is no peripheral edema. There no musculoskeletal deformities. There no skin rashes. I did not fully examine her shoulder.  Filed Vitals:   12/19/11 1054  BP: 138/90  Pulse: 50  Height: 5\' 4"  (1.626 m)  Weight: 159 lb (72.122 kg)   EKG is done today and reviewed by me. She has old sinus bradycardia. She has old diffuse nonspecific ST-T wave changes. There is  no change.  ASSESSMENT & PLAN

## 2011-12-19 NOTE — Assessment & Plan Note (Signed)
Blood pressures controlled. No change in therapy. 

## 2011-12-19 NOTE — Assessment & Plan Note (Addendum)
The patient is quite stable. Her last exercise test was 2009. She is fully active. She's not having any significant symptoms. She has no chest pain or signs of heart failure. She has not had any significant arrhythmias. She is stable and cleared for her shoulder surgery.  The patient tells me that she is taking over the counter potassium. I suspect that this is a small dose. However I made it quite clear to her that she needs to not take an excessive amount. In addition chemistry will be checked today to be sure that her potassium is okay.

## 2011-12-19 NOTE — Assessment & Plan Note (Signed)
Patient is statin intolerant. No change in therapy. 

## 2011-12-19 NOTE — Assessment & Plan Note (Signed)
Patient has chronic sinus bradycardia. She has not had any significant symptoms. No further workup is needed.

## 2011-12-19 NOTE — Patient Instructions (Addendum)
Your physician recommends that you return for lab work in: today  Your physician recommends that you continue on your current medications as directed. Please refer to the Current Medication list given to you today. Please do not take any more OTC Potassium than you are at this time.  Your physician wants you to follow-up in: 6 months You will receive a reminder letter in the mail two months in advance. If you don't receive a letter, please call our office to schedule the follow-up appointment.

## 2011-12-19 NOTE — Assessment & Plan Note (Signed)
Coronary disease is stable. The patient had a drug-eluting stent in 2009. Later that year she had a followup stress echo that showed no ischemia. She is fully active with a good exercise level. She has no symptoms. She needs no further workup at this time.

## 2012-03-20 HISTORY — PX: EYE SURGERY: SHX253

## 2012-05-24 ENCOUNTER — Ambulatory Visit: Payer: Medicare Other | Admitting: Cardiology

## 2012-06-17 ENCOUNTER — Ambulatory Visit (INDEPENDENT_AMBULATORY_CARE_PROVIDER_SITE_OTHER): Payer: Medicare Other | Admitting: Cardiology

## 2012-06-17 VITALS — BP 124/70 | HR 52 | Ht 64.0 in | Wt 165.8 lb

## 2012-06-17 DIAGNOSIS — I2510 Atherosclerotic heart disease of native coronary artery without angina pectoris: Secondary | ICD-10-CM

## 2012-06-17 DIAGNOSIS — I10 Essential (primary) hypertension: Secondary | ICD-10-CM

## 2012-06-17 DIAGNOSIS — R001 Bradycardia, unspecified: Secondary | ICD-10-CM

## 2012-06-17 DIAGNOSIS — I498 Other specified cardiac arrhythmias: Secondary | ICD-10-CM

## 2012-06-17 NOTE — Assessment & Plan Note (Signed)
Blood pressure is under good control. No change in therapy 

## 2012-06-17 NOTE — Patient Instructions (Addendum)
Your physician wants you to follow-up in: 1 year. You will receive a reminder letter in the mail two months in advance. If you don't receive a letter, please call our office to schedule the follow-up appointment.  

## 2012-06-17 NOTE — Assessment & Plan Note (Signed)
Patient continues to have mild bradycardia. She does not have any symptoms. No change in therapy.

## 2012-06-17 NOTE — Assessment & Plan Note (Signed)
Coronary disease is stable.  She does not need any further workup. 

## 2012-06-17 NOTE — Progress Notes (Signed)
HPI  Patient is seen back in followup of coronary disease and hypertension. I saw her last October, 2013. At that time we cleared her for shoulder surgery. She had the surgery she is doing well. She's not having any chest pain. It sounds as if she had some hypertension after her shoulder surgery. She was given some Bystolic by Dr. Yetta Flock. The patient said she felt poorly with it. In fact her blood pressure improved and has remained stable. She's not on medication at this time.  Allergies  Allergen Reactions  . Bystolic (Nebivolol Hcl) Shortness Of Breath    SHORTNESS OF BREATH  . Cephalexin   . Ciprofloxacin   . Clindamycin   . Codeine   . Hydrocodone-Acetaminophen   . Levothyroxine Sodium   . Oxycodone Hcl   . Penicillins   . Propoxyphene Hcl   . Propoxyphene-Acetaminophen   . Tetanus Toxoid   . Tetracycline     Current Outpatient Prescriptions  Medication Sig Dispense Refill  . aspirin 81 MG tablet Take 81 mg by mouth daily.       . B Complex-C (B-COMPLEX WITH VITAMIN C) tablet Take 1 tablet by mouth daily.        . calcium citrate-vitamin D (CITRACAL+D) 315-200 MG-UNIT per tablet Take 2 tablets by mouth daily.       Marland Kitchen dicyclomine (BENTYL) 10 MG capsule Take 10 mg by mouth as needed.        Marland Kitchen estropipate (OGEN) 0.75 MG tablet Take 0.75 mg by mouth. M, W, F       . Flaxseed, Linseed, (FLAX SEED OIL PO) Take by mouth daily.        . Glucosamine 500 MG CAPS Take by mouth. UAD       . loratadine (CLARITIN) 10 MG tablet Take 10 mg by mouth as needed for allergies.      . Magnesium 300 MG CAPS Take 3 capsules by mouth daily.        . Multiple Vitamin (MULTIVITAMIN) capsule Take 1 capsule by mouth daily.        . naproxen sodium (ANAPROX) 220 MG tablet as needed.        Marland Kitchen POTASSIUM PO Take 2 tablets by mouth daily.       . diphenhydrAMINE (BENADRYL) 25 MG tablet as needed.         No current facility-administered medications for this visit.    History   Social History  .  Marital Status: Widowed    Spouse Name: N/A    Number of Children: N/A  . Years of Education: N/A   Occupational History  . Retired    Social History Main Topics  . Smoking status: Never Smoker   . Smokeless tobacco: Not on file  . Alcohol Use: No  . Drug Use: No  . Sexually Active: Not on file   Other Topics Concern  . Not on file   Social History Narrative  . No narrative on file    Family History  Problem Relation Age of Onset  . Cancer    . Coronary artery disease      Past Medical History  Diagnosis Date  . CAD (coronary artery disease) 03/2007    DES to LAD, January, 2009 / stress echo, July, 2009 elsewhere, no ischemia  . Multiple thyroid nodules     small heterogenious nodules...OK  . Retroperitoneal bleed     post cath 2004-surgery  . HTN (hypertension)   . Dyslipidemia   .  Bradycardia 05/2010    March, 2012  . SOB (shortness of breath) 05/2010    March, 2012  . Statin intolerance   . Preop cardiovascular exam     Cardiac clearance for shoulder surgery October, 2013    No past surgical history on file.  Patient Active Problem List  Diagnosis  . CAD (coronary artery disease)  . Multiple thyroid nodules  . Retroperitoneal bleed  . HTN (hypertension)  . Dyslipidemia  . Bradycardia  . SOB (shortness of breath)  . Statin intolerance  . Preop cardiovascular exam    ROS   Patient denies fever, chills, headache, sweats, rash, change in vision, change in hearing, chest pain, cough, nausea vomiting, urinary symptoms. Her shoulder is improving. All other systems are reviewed and are negative.  PHYSICAL EXAM   Patient is oriented to person time and place. Affect is normal. Lungs are clear. Respiratory effort is nonlabored. Cardiac exam reveals S1 and S2. There no clicks or significant murmurs. The abdomen is soft. There is no peripheral edema.  Filed Vitals:   06/17/12 1558  BP: 124/70  Pulse: 52  Height: 5\' 4"  (1.626 m)  Weight: 165 lb 12.8 oz  (75.206 kg)    ASSESSMENT & PLAN

## 2013-06-04 ENCOUNTER — Ambulatory Visit (INDEPENDENT_AMBULATORY_CARE_PROVIDER_SITE_OTHER): Payer: Medicare Other | Admitting: Cardiology

## 2013-06-04 ENCOUNTER — Encounter: Payer: Self-pay | Admitting: Cardiology

## 2013-06-04 VITALS — BP 152/90 | HR 51 | Ht 64.0 in | Wt 166.0 lb

## 2013-06-04 DIAGNOSIS — R001 Bradycardia, unspecified: Secondary | ICD-10-CM

## 2013-06-04 DIAGNOSIS — I10 Essential (primary) hypertension: Secondary | ICD-10-CM

## 2013-06-04 DIAGNOSIS — R0989 Other specified symptoms and signs involving the circulatory and respiratory systems: Secondary | ICD-10-CM | POA: Insufficient documentation

## 2013-06-04 DIAGNOSIS — I498 Other specified cardiac arrhythmias: Secondary | ICD-10-CM

## 2013-06-04 DIAGNOSIS — E785 Hyperlipidemia, unspecified: Secondary | ICD-10-CM

## 2013-06-04 DIAGNOSIS — I2510 Atherosclerotic heart disease of native coronary artery without angina pectoris: Secondary | ICD-10-CM

## 2013-06-04 NOTE — Progress Notes (Signed)
HPI  Patient is seen today to followup coronary disease and hypertension. She checks her pressure regularly at home and it is definitely normal. It was normal at the time of her physical exam with her primary physician recently. There is mild elevation today. She is very active working outside in her yard. She has documented coronary disease. She's not having any chest pain or shortness of breath. Overall she's doing very well.  Allergies  Allergen Reactions  . Bystolic USAA Hcl] Shortness Of Breath    SHORTNESS OF BREATH  . Cephalexin   . Ciprofloxacin   . Clindamycin   . Codeine   . Hydrocodone-Acetaminophen   . Levothyroxine Sodium   . Oxycodone Hcl   . Penicillins   . Propoxyphene Hcl   . Propoxyphene N-Acetaminophen   . Tetanus Toxoid   . Tetracycline     Current Outpatient Prescriptions  Medication Sig Dispense Refill  . aspirin 81 MG tablet Take 81 mg by mouth daily.       . B Complex-C (B-COMPLEX WITH VITAMIN C) tablet Take 1 tablet by mouth daily.        . calcium citrate-vitamin D (CITRACAL+D) 315-200 MG-UNIT per tablet Take 2 tablets by mouth daily.       Marland Kitchen dicyclomine (BENTYL) 10 MG capsule Take 10 mg by mouth as needed.        . diphenhydrAMINE (BENADRYL) 25 MG tablet as needed.        Marland Kitchen estropipate (OGEN) 0.75 MG tablet Take 0.75 mg by mouth. M, W, F       . Flaxseed, Linseed, (FLAX SEED OIL PO) Take by mouth daily.        . Glucosamine 500 MG CAPS Take by mouth. UAD       . Magnesium 300 MG CAPS Take 3 capsules by mouth daily.        . Multiple Vitamin (MULTIVITAMIN) capsule Take 1 capsule by mouth daily.        . naproxen sodium (ANAPROX) 220 MG tablet as needed.        Marland Kitchen POTASSIUM PO Take 2 tablets by mouth daily.        No current facility-administered medications for this visit.    History   Social History  . Marital Status: Widowed    Spouse Name: N/A    Number of Children: N/A  . Years of Education: N/A   Occupational History  . Retired     Social History Main Topics  . Smoking status: Never Smoker   . Smokeless tobacco: Not on file  . Alcohol Use: No  . Drug Use: No  . Sexual Activity: Not on file   Other Topics Concern  . Not on file   Social History Narrative  . No narrative on file    Family History  Problem Relation Age of Onset  . Cancer    . Coronary artery disease      Past Medical History  Diagnosis Date  . CAD (coronary artery disease) 03/2007    DES to LAD, January, 2009 / stress echo, July, 2009 elsewhere, no ischemia  . Multiple thyroid nodules     small heterogenious nodules...OK  . Retroperitoneal bleed     post cath 2004-surgery  . HTN (hypertension)   . Dyslipidemia   . Bradycardia 05/2010    March, 2012  . SOB (shortness of breath) 05/2010    March, 2012  . Statin intolerance   . Preop cardiovascular exam  Cardiac clearance for shoulder surgery October, 2013    History reviewed. No pertinent past surgical history.  Patient Active Problem List   Diagnosis Date Noted  . Left carotid bruit 06/04/2013  . Preop cardiovascular exam   . Statin intolerance   . Multiple thyroid nodules   . Retroperitoneal bleed   . HTN (hypertension)   . Dyslipidemia   . Bradycardia 05/19/2010  . SOB (shortness of breath) 05/19/2010  . CAD (coronary artery disease) 03/21/2007    ROS   Patient denies fever, chills, headache, sweats, rash, change in vision, change in hearing, chest pain, cough, nausea vomiting, urinary symptoms. All other systems are reviewed and are negative.  PHYSICAL EXAM  Patient is oriented to person time and place. Affect is normal. There is no jugulovenous distention. There is a soft left carotid bruit. Lungs are clear. Respiratory effort is nonlabored. Cardiac exam reveals S1 and S2. There no clicks or significant murmurs. The abdomen is soft. There is no peripheral edema. There no musculoskeletal deformities. There are no skin rashes.  Filed Vitals:   06/04/13 0859  BP:  152/90  Pulse: 51  Height: 5\' 4"  (1.626 m)  Weight: 166 lb (75.297 kg)   EKG is done today and reviewed by me. There is sinus bradycardia. There are old diffuse ST-T wave changes. There is no significant change when compared to the past.  ASSESSMENT & PLAN

## 2013-06-04 NOTE — Assessment & Plan Note (Signed)
She has chronic sinus bradycardia. No change in therapy.

## 2013-06-04 NOTE — Assessment & Plan Note (Signed)
Her blood pressure is definitely controlled. No change in therapy.

## 2013-06-04 NOTE — Assessment & Plan Note (Signed)
Unfortunately she is statin intolerant. No change in therapy at this time.

## 2013-06-04 NOTE — Assessment & Plan Note (Signed)
Today I hear a left carotid bruit. We have never done a carotid Doppler. This will be done. It is of note that her husband had carotid surgery in the past. She tells me that he had a stroke at the time of the surgery. He passed away many years ago. She is in agreement with doing a screening Doppler to be sure that she is stable.  As part of today's evaluation I spent greater than 25 minutes with her total care. More than half of this time has been with direct discussion with her talking about all of her medical issues and talking about the specifics of a carotid Doppler in her husband's history.

## 2013-06-04 NOTE — Assessment & Plan Note (Signed)
Coronary disease is stable. Unfortunately she is statin intolerant. Her intervention was done in 2009. She is fully active. She does not need an exercise test at this point.

## 2013-06-04 NOTE — Patient Instructions (Signed)
Your physician has requested that you have a carotid duplex. This test is an ultrasound of the carotid arteries in your neck. It looks at blood flow through these arteries that supply the brain with blood. Allow one hour for this exam. There are no restrictions or special instructions.  Your physician recommends that you continue on your current medications as directed. Please refer to the Current Medication list given to you today.  Your physician wants you to follow-up in: 1 year. You will receive a reminder letter in the mail two months in advance. If you don't receive a letter, please call our office to schedule the follow-up appointment.

## 2013-06-11 ENCOUNTER — Ambulatory Visit (HOSPITAL_COMMUNITY): Payer: Medicare Other | Attending: Cardiology | Admitting: *Deleted

## 2013-06-11 DIAGNOSIS — R0989 Other specified symptoms and signs involving the circulatory and respiratory systems: Secondary | ICD-10-CM

## 2013-06-11 DIAGNOSIS — I6529 Occlusion and stenosis of unspecified carotid artery: Secondary | ICD-10-CM

## 2013-06-11 NOTE — Progress Notes (Signed)
Carotid duplex complete. TLF 

## 2013-06-13 ENCOUNTER — Encounter: Payer: Self-pay | Admitting: Cardiology

## 2013-06-13 DIAGNOSIS — I779 Disorder of arteries and arterioles, unspecified: Secondary | ICD-10-CM | POA: Insufficient documentation

## 2013-06-13 DIAGNOSIS — I739 Peripheral vascular disease, unspecified: Secondary | ICD-10-CM

## 2013-06-17 ENCOUNTER — Telehealth: Payer: Self-pay | Admitting: Cardiology

## 2013-06-17 NOTE — Telephone Encounter (Signed)
New problem ° ° °Pt returning call from nurse. °

## 2013-06-17 NOTE — Telephone Encounter (Signed)
Pt was called with results of her carotids. [place] was called and asked to remove the 1 year f/u.

## 2014-04-11 ENCOUNTER — Inpatient Hospital Stay (HOSPITAL_COMMUNITY)
Admission: AD | Admit: 2014-04-11 | Discharge: 2014-04-14 | DRG: 313 | Disposition: A | Discharge status: Home / Self Care | Payer: Medicare Other | Source: Other Acute Inpatient Hospital | Attending: Cardiovascular Disease | Admitting: Cardiovascular Disease

## 2014-04-11 ENCOUNTER — Encounter (HOSPITAL_COMMUNITY): Payer: Self-pay | Admitting: General Practice

## 2014-04-11 DIAGNOSIS — R001 Bradycardia, unspecified: Secondary | ICD-10-CM | POA: Diagnosis not present

## 2014-04-11 DIAGNOSIS — I493 Ventricular premature depolarization: Secondary | ICD-10-CM | POA: Diagnosis not present

## 2014-04-11 DIAGNOSIS — I10 Essential (primary) hypertension: Secondary | ICD-10-CM

## 2014-04-11 DIAGNOSIS — D649 Anemia, unspecified: Secondary | ICD-10-CM | POA: Diagnosis present

## 2014-04-11 DIAGNOSIS — N184 Chronic kidney disease, stage 4 (severe): Secondary | ICD-10-CM | POA: Diagnosis present

## 2014-04-11 DIAGNOSIS — Z7982 Long term (current) use of aspirin: Secondary | ICD-10-CM

## 2014-04-11 DIAGNOSIS — Z9861 Coronary angioplasty status: Secondary | ICD-10-CM

## 2014-04-11 DIAGNOSIS — N183 Chronic kidney disease, stage 3 unspecified: Secondary | ICD-10-CM

## 2014-04-11 DIAGNOSIS — R748 Abnormal levels of other serum enzymes: Secondary | ICD-10-CM | POA: Diagnosis present

## 2014-04-11 DIAGNOSIS — E669 Obesity, unspecified: Secondary | ICD-10-CM | POA: Diagnosis present

## 2014-04-11 DIAGNOSIS — I129 Hypertensive chronic kidney disease with stage 1 through stage 4 chronic kidney disease, or unspecified chronic kidney disease: Secondary | ICD-10-CM | POA: Diagnosis present

## 2014-04-11 DIAGNOSIS — Z6827 Body mass index (BMI) 27.0-27.9, adult: Secondary | ICD-10-CM

## 2014-04-11 DIAGNOSIS — I2510 Atherosclerotic heart disease of native coronary artery without angina pectoris: Secondary | ICD-10-CM | POA: Diagnosis not present

## 2014-04-11 DIAGNOSIS — E785 Hyperlipidemia, unspecified: Secondary | ICD-10-CM | POA: Diagnosis not present

## 2014-04-11 DIAGNOSIS — Z8249 Family history of ischemic heart disease and other diseases of the circulatory system: Secondary | ICD-10-CM

## 2014-04-11 DIAGNOSIS — N179 Acute kidney failure, unspecified: Secondary | ICD-10-CM | POA: Diagnosis not present

## 2014-04-11 DIAGNOSIS — Z955 Presence of coronary angioplasty implant and graft: Secondary | ICD-10-CM | POA: Diagnosis not present

## 2014-04-11 DIAGNOSIS — R0602 Shortness of breath: Secondary | ICD-10-CM

## 2014-04-11 DIAGNOSIS — R0789 Other chest pain: Secondary | ICD-10-CM | POA: Diagnosis not present

## 2014-04-11 DIAGNOSIS — R778 Other specified abnormalities of plasma proteins: Secondary | ICD-10-CM | POA: Diagnosis present

## 2014-04-11 DIAGNOSIS — R079 Chest pain, unspecified: Secondary | ICD-10-CM | POA: Diagnosis not present

## 2014-04-11 DIAGNOSIS — Z789 Other specified health status: Secondary | ICD-10-CM

## 2014-04-11 DIAGNOSIS — R072 Precordial pain: Secondary | ICD-10-CM | POA: Diagnosis not present

## 2014-04-11 DIAGNOSIS — R7989 Other specified abnormal findings of blood chemistry: Secondary | ICD-10-CM | POA: Diagnosis present

## 2014-04-11 HISTORY — DX: Zoster without complications: B02.9

## 2014-04-11 HISTORY — DX: Unspecified injury of shoulder and upper arm, unspecified arm, initial encounter: S49.90XA

## 2014-04-11 HISTORY — DX: Unspecified injury of unspecified wrist, hand and finger(s), initial encounter: S69.90XA

## 2014-04-11 HISTORY — DX: Unspecified hemorrhoids: K64.9

## 2014-04-11 NOTE — H&P (Addendum)
PCP:   Marco Collie, MD   Primary cardiologist: Dola Argyle MD  Chief Complaint:  Chest pain/SOB  HPI:  81 y/o somewhat poor historian with known CAD s/p DES to LAD in 2009, HTN (not on anti-HTN medications), bradycardia (chronic and sinus) who was transferred from [facility] with CP and SOB. Pt reoported some SOB, dizziness from the last few weeks. She however started feeling worse on 1/23 night. She had SOB, BP >200/100 mm Hg (on her own check). Pt also had chest squeezing 10/10 in severety  Substernal, radiating to the back and neck, also throughout pt's chest. Pt called the EMS services and was brought to [facility] where she received nitroglycerine, and as with resolution of Chest pain Patient denied active symptoms of chest pain, shortness of breath, PND orthopnea, lower extremity edema, frequent or prolonged palpitations, lower extremity edema, nausea vomiting, chills, fevers, dysuria  Review of Systems:  12 systems were reviewed and were negative except mentioned in the HPI  Past Medical History: Past Medical History  Diagnosis Date  . CAD (coronary artery disease) 03/2007    DES to LAD, January, 2009 / stress echo, July, 2009 elsewhere, no ischemia  . Multiple thyroid nodules     small heterogenious nodules...OK  . Retroperitoneal bleed     post cath 2004-surgery  . HTN (hypertension)   . Dyslipidemia   . Bradycardia 05/2010    March, 2012  . SOB (shortness of breath) 05/2010    March, 2012  . Statin intolerance   . Preop cardiovascular exam     Cardiac clearance for shoulder surgery October, 2013   No past surgical history on file.  Medications: Prior to Admission medications   Medication Sig Start Date End Date Taking? Authorizing Provider  aspirin 81 MG tablet Take 81 mg by mouth daily.     Historical Provider, MD  B Complex-C (B-COMPLEX WITH VITAMIN C) tablet Take 1 tablet by mouth daily.      Historical Provider, MD  calcium citrate-vitamin D  (CITRACAL+D) 315-200 MG-UNIT per tablet Take 2 tablets by mouth daily.     Historical Provider, MD  dicyclomine (BENTYL) 10 MG capsule Take 10 mg by mouth as needed.      Historical Provider, MD  diphenhydrAMINE (BENADRYL) 25 MG tablet as needed.      Historical Provider, MD  estropipate (OGEN) 0.75 MG tablet Take 0.75 mg by mouth. Venita Sheffield, F     Historical Provider, MD  Flaxseed, Linseed, (FLAX SEED OIL PO) Take by mouth daily.      Historical Provider, MD  Glucosamine 500 MG CAPS Take by mouth. UAD     Historical Provider, MD  Magnesium 300 MG CAPS Take 3 capsules by mouth daily.      Historical Provider, MD  Multiple Vitamin (MULTIVITAMIN) capsule Take 1 capsule by mouth daily.      Historical Provider, MD  naproxen sodium (ANAPROX) 220 MG tablet as needed.      Historical Provider, MD  POTASSIUM PO Take 2 tablets by mouth daily.     Historical Provider, MD    Allergies:   Allergies  Allergen Reactions  . Bystolic USAA Hcl] Shortness Of Breath    SHORTNESS OF BREATH  . Cephalexin   . Ciprofloxacin   . Clindamycin   . Codeine   . Hydrocodone-Acetaminophen   . Levothyroxine Sodium   . Oxycodone Hcl   . Penicillins   . Propoxyphene Hcl   . Propoxyphene N-Acetaminophen   . Tetanus  Toxoid   . Tetracycline     Social History:  reports that she has never smoked. She does not have any smokeless tobacco history on file. She reports that she does not drink alcohol or use illicit drugs.  Family History: Family History  Problem Relation Age of Onset  . Cancer    . Coronary artery disease      PHYSICAL EXAM:  Filed Vitals:   04/11/14 2350  BP: 158/53  Pulse: 49  Temp: 98 F (36.7 C)  Resp: 18   General:  Well appearing. No respiratory difficulty HEENT: normal Neck: supple. no JVD. Carotids 2+ bilat; no bruits. No lymphadenopathy or thryomegaly appreciated. Cor: PMI nondisplaced. Regular rate & rhythm. No rubs, gallops or murmurs. Lungs: clear to  auscultaation Abdomen: soft, nontender, nondistended. No hepatosplenomegaly. No bruits or masses. Good bowel sounds. Extremities: no cyanosis, clubbing, rash, edema Neuro: alert & oriented x 3, cranial nerves grossly intact. moves all 4 extremities w/o difficulty. Affect pleasant.  Labs from OSH:  CBC: White cell count 8.4, hemoglobin 12.9, hematocrit 39.3, platelets 189 INR 1.1 Urinalysis negative for infection Chemistry: Sodium 144, potassium 3.7, chloride 102, CO2 32, glucose 108, BUN 28, creatinine 2.0, GFR 24 anion gap 14 calcium 10.4, albumin 4.1, total protein 7.1 troponin negative, myoglobin within normal limits, proBNP 3290, LFTs were normal CXR report from OSH: no acute cardio-pulmonary process.   EKG from [facility] personally reviewed and interpreted by me: NSR 61 bpm, horizontal axis, LVH with repol changes. Not significantly changed compare to previous EKGs  Assessment/Plan Chest Pain / SOB CAD s/p DES to LAD in 2009 HTN uncotrolled Acute on Chronic renal failure  Pt presented with chest pain, associated with SOB and elevated BP. Not on BP meds at baseline with normal BP per report. CP resolved with NTG at OSH. Pt has known CAD Creatinine is elevated compare to baseline, but last time it was checked in 2013 ... Acuity of the process is not clear.   Plan: - troponin x 3 - lipid profile in AM - asa 81 mg daily - no beta-blocker for chronic sinus brady and hx of bistolic intolerance - no statin for because of statin intolerance  - monitor renal functions, I/O, daily weights, consider further work up down the line.  - pt will lilkely need further ischemic eval, probably non-invasive due to renal insussicience.   Vanessa [name] 04/11/2014, 5:35 PM

## 2014-04-12 ENCOUNTER — Inpatient Hospital Stay (HOSPITAL_COMMUNITY): Payer: Medicare Other

## 2014-04-12 DIAGNOSIS — R079 Chest pain, unspecified: Secondary | ICD-10-CM | POA: Diagnosis present

## 2014-04-12 DIAGNOSIS — R072 Precordial pain: Secondary | ICD-10-CM

## 2014-04-12 LAB — CREATININE, SERUM
CREATININE: 2.36 mg/dL — AB (ref 0.50–1.10)
GFR calc non Af Amer: 18 mL/min — ABNORMAL LOW (ref >90)
GFR, EST AFRICAN AMERICAN: 21 mL/min — AB (ref >90)

## 2014-04-12 LAB — BASIC METABOLIC PANEL
Anion gap: 6 (ref 5–15)
BUN: 25 mg/dL — AB (ref 6–23)
CALCIUM: 9.9 mg/dL (ref 8.4–10.5)
CO2: 32 mmol/L (ref 19–32)
Chloride: 103 mmol/L (ref 96–112)
Creatinine, Ser: 2.14 mg/dL — ABNORMAL HIGH (ref 0.50–1.10)
GFR, EST AFRICAN AMERICAN: 23 mL/min — AB (ref >90)
GFR, EST NON AFRICAN AMERICAN: 20 mL/min — AB (ref >90)
GLUCOSE: 90 mg/dL (ref 70–99)
Potassium: 3.8 mmol/L (ref 3.5–5.1)
Sodium: 141 mmol/L (ref 135–145)

## 2014-04-12 LAB — CBC
HEMATOCRIT: 35.6 % — AB (ref 36.0–46.0)
HEMATOCRIT: 37.3 % (ref 36.0–46.0)
HEMOGLOBIN: 12.2 g/dL (ref 12.0–15.0)
Hemoglobin: 11.4 g/dL — ABNORMAL LOW (ref 12.0–15.0)
MCH: 31.6 pg (ref 26.0–34.0)
MCH: 32.4 pg (ref 26.0–34.0)
MCHC: 32 g/dL (ref 30.0–36.0)
MCHC: 32.7 g/dL (ref 30.0–36.0)
MCV: 98.6 fL (ref 78.0–100.0)
MCV: 98.9 fL (ref 78.0–100.0)
PLATELETS: 205 10*3/uL (ref 150–400)
PLATELETS: 205 10*3/uL (ref 150–400)
RBC: 3.61 MIL/uL — AB (ref 3.87–5.11)
RBC: 3.77 MIL/uL — ABNORMAL LOW (ref 3.87–5.11)
RDW: 13.1 % (ref 11.5–15.5)
RDW: 13.2 % (ref 11.5–15.5)
WBC: 11.3 10*3/uL — ABNORMAL HIGH (ref 4.0–10.5)
WBC: 9.6 10*3/uL (ref 4.0–10.5)

## 2014-04-12 LAB — LIPID PANEL
CHOLESTEROL: 191 mg/dL (ref 0–200)
HDL: 40 mg/dL (ref >39)
LDL CALC: 131 mg/dL — AB (ref 0–99)
Total CHOL/HDL Ratio: 4.8 RATIO
Triglycerides: 100 mg/dL (ref <150)
VLDL: 20 mg/dL (ref 0–40)

## 2014-04-12 LAB — TROPONIN I
Troponin I: 0.08 ng/mL — ABNORMAL HIGH (ref <0.031)
Troponin I: 0.07 ng/mL — ABNORMAL HIGH (ref <0.031)
Troponin I: 0.06 ng/mL — ABNORMAL HIGH (ref <0.031)

## 2014-04-12 LAB — TSH: TSH: 6.722 u[IU]/mL — ABNORMAL HIGH (ref 0.350–4.500)

## 2014-04-12 MED ORDER — ALUM & MAG HYDROXIDE-SIMETH 200-200-20 MG/5ML PO SUSP
30.0000 mL | ORAL | Status: DC | PRN
  Administered 2014-04-12 – 2014-04-13 (×2): 30 mL via ORAL
  Filled 2014-04-12 (×2): qty 30

## 2014-04-12 MED ORDER — NAPROXEN 250 MG PO TABS: 250.0000 mg | ORAL_TABLET | Freq: Two times a day (BID) | ORAL | Status: DC

## 2014-04-12 MED ORDER — TECHNETIUM TC 99M SESTAMIBI GENERIC - CARDIOLITE
10.0000 | Freq: Once | INTRAVENOUS | Status: AC | PRN
  Administered 2014-04-12: 10 via INTRAVENOUS

## 2014-04-12 MED ORDER — REGADENOSON 0.4 MG/5ML IV SOLN
0.4000 mg | Freq: Once | INTRAVENOUS | Status: AC
  Administered 2014-04-12: 0.4 mg via INTRAVENOUS
  Filled 2014-04-12: qty 5

## 2014-04-12 MED ORDER — ASPIRIN EC 81 MG PO TBEC
81.0000 mg | DELAYED_RELEASE_TABLET | Freq: Every day | ORAL | Status: DC
  Administered 2014-04-12 – 2014-04-14 (×3): 81 mg via ORAL
  Filled 2014-04-12 (×3): qty 1

## 2014-04-12 MED ORDER — NAPROXEN 250 MG PO TABS
250.0000 mg | ORAL_TABLET | Freq: Two times a day (BID) | ORAL | Status: DC | PRN
  Administered 2014-04-12: 250 mg via ORAL
  Filled 2014-04-12: qty 1

## 2014-04-12 MED ORDER — NITROGLYCERIN 0.4 MG SL SUBL: 0.4000 mg | SUBLINGUAL_TABLET | SUBLINGUAL | Status: DC | PRN

## 2014-04-12 MED ORDER — HEPARIN SODIUM (PORCINE) 5000 UNIT/ML IJ SOLN
5000.0000 [IU] | Freq: Three times a day (TID) | INTRAMUSCULAR | Status: DC
  Administered 2014-04-12 – 2014-04-14 (×5): 5000 [IU] via SUBCUTANEOUS
  Filled 2014-04-12 (×5): qty 1

## 2014-04-12 MED ORDER — DICYCLOMINE HCL 10 MG PO CAPS
10.0000 mg | ORAL_CAPSULE | Freq: Three times a day (TID) | ORAL | Status: DC | PRN
  Administered 2014-04-13 – 2014-04-14 (×2): 10 mg via ORAL
  Filled 2014-04-12 (×2): qty 1

## 2014-04-12 MED ORDER — ACETAMINOPHEN 325 MG PO TABS
ORAL_TABLET | ORAL | Status: AC
  Filled 2014-04-12: qty 2

## 2014-04-12 MED ORDER — ACETAMINOPHEN 325 MG PO TABS
650.0000 mg | ORAL_TABLET | Freq: Once | ORAL | Status: AC
  Administered 2014-04-12: 650 mg via ORAL

## 2014-04-12 MED ORDER — ONDANSETRON HCL 4 MG/2ML IJ SOLN: 4.0000 mg | Freq: Four times a day (QID) | INTRAMUSCULAR | Status: DC | PRN

## 2014-04-12 MED ORDER — REGADENOSON 0.4 MG/5ML IV SOLN
INTRAVENOUS | Status: AC
  Administered 2014-04-12: 0.4 mg via INTRAVENOUS
  Filled 2014-04-12: qty 5

## 2014-04-12 MED ORDER — ACETAMINOPHEN 325 MG PO TABS: 650.0000 mg | ORAL_TABLET | Freq: Four times a day (QID) | ORAL | Status: DC | PRN

## 2014-04-12 MED ORDER — TECHNETIUM TC 99M SESTAMIBI - CARDIOLITE
30.0000 | Freq: Once | INTRAVENOUS | Status: AC | PRN
  Administered 2014-04-12: 11:00:00 30 via INTRAVENOUS

## 2014-04-12 NOTE — Progress Notes (Signed)
Subjective: Pain free.    Objective: Vital signs in last 24 hours: Temp:  [97.8 F (36.6 C)-98 F (36.7 C)] 97.8 F (36.6 C) (01/24 0609) Pulse Rate:  [43-49] 43 (01/24 0609) Resp:  [18] 18 (01/24 0609) BP: (124-158)/(44-53) 124/44 mmHg (01/24 0609) SpO2:  [96 %-100 %] 100 % (01/24 0609) Weight:  [161 lb 6 oz (73.2 kg)-161 lb 6.4 oz (73.211 kg)] 161 lb 6 oz (73.2 kg) (01/24 0609) Last BM Date: 04/11/14 (per pt)  Intake/Output from previous day:   Intake/Output this shift:    Medications Current Facility-Administered Medications  Medication Dose Route Frequency Provider Last Rate Last Dose  . aspirin EC tablet 81 mg  81 mg Oral Daily Inez Pilgrim, MD      . dicyclomine (BENTYL) capsule 10 mg  10 mg Oral Q8H PRN Inez Pilgrim, MD      . heparin injection 5,000 Units  5,000 Units Subcutaneous 3 times per day Inez Pilgrim, MD      . nitroGLYCERIN (NITROSTAT) SL tablet 0.4 mg  0.4 mg Sublingual Q5 Min x 3 PRN Inez Pilgrim, MD      . ondansetron [facility]) injection 4 mg  4 mg Intravenous Q6H PRN Inez Pilgrim, MD        PE: General appearance: alert, cooperative and no distress Lungs: clear to auscultation bilaterally Heart: Rate reg and slow.  No MRG Extremities: No LEE Pulses: 2+ and symmetric Skin: Warm and dry Neurologic: Grossly normal  Lab Results:   Recent Labs  04/12/14 0120  WBC 11.3*  HGB 11.4*  HCT 35.6*  PLT 205   BMET  Recent Labs  04/12/14 0120  CREATININE 2.36*   PT/INR No results for input(s): LABPROT, INR in the last 72 hours. Cholesterol  Recent Labs  04/12/14 0120  CHOL 191   Lipid Panel     Component Value Date/Time   CHOL 191 04/12/2014 0120   TRIG 100 04/12/2014 0120   HDL 40 04/12/2014 0120   CHOLHDL 4.8 04/12/2014 0120   VLDL 20 04/12/2014 0120   LDLCALC 131* 04/12/2014 0120     Cardiac Panel (last 3 results)  Recent Labs  04/12/14 0120  TROPONINI 0.08*    Assessment/Plan 81 y/o with known CAD  s/p DES to LAD in 2009, HTN (not on anti-HTN medications), bradycardia (chronic and sinus) who was transferred from [facility] with CP and SOB.  Nitro responsive.  She reports feeling "poorly" for a couple weeks now.    Principal Problem:   Chest pain Active Problems:   CAD (coronary artery disease)   HTN (hypertension)   Dyslipidemia   Bradycardia   Statin intolerance  Plan:   Her symptoms are quite convincing: chest squeezing with radiation to neck and left shoulder.  She is chest pain free currently.  Initial troponin is 0.08. Related to HTN?   Inferior and anterolateral TWI are not new.   LDL is not controlled.  SCr is 2.36.   She has no history listed regarding CKD(SCr 1.4 two years ago).  SCr at [facility] was 2.00.  BP is much better and she is not on any meds, nor was she at home.  No BB due to bradycardia.  HR over night in the 30's.  I am concerned about her worsening kidney function and possible need for a heart cath.  Will check an echo.  At his point a Lexiscan may be the best first choice.       LOS: 1 day  Tarri Fuller PA-C 04/12/2014 7:15 AM

## 2014-04-12 NOTE — Progress Notes (Signed)
    Underwent nuclear stress test. Tolerated procedure well.  Ginnie Marich Stern PA-C  [facility]    

## 2014-04-12 NOTE — Plan of Care (Signed)
Problem: Phase I Progression Outcomes Goal: Aspirin unless contraindicated Outcome: Completed/Met Date Met:  04/12/14 Pt given 4 70m ASA by ems

## 2014-04-13 ENCOUNTER — Inpatient Hospital Stay (HOSPITAL_COMMUNITY): Payer: Medicare Other

## 2014-04-13 ENCOUNTER — Encounter (HOSPITAL_COMMUNITY): Payer: Self-pay | Admitting: *Deleted

## 2014-04-13 DIAGNOSIS — R079 Chest pain, unspecified: Principal | ICD-10-CM

## 2014-04-13 DIAGNOSIS — E785 Hyperlipidemia, unspecified: Secondary | ICD-10-CM

## 2014-04-13 DIAGNOSIS — R001 Bradycardia, unspecified: Secondary | ICD-10-CM

## 2014-04-13 DIAGNOSIS — I2510 Atherosclerotic heart disease of native coronary artery without angina pectoris: Secondary | ICD-10-CM

## 2014-04-13 DIAGNOSIS — I10 Essential (primary) hypertension: Secondary | ICD-10-CM

## 2014-04-13 DIAGNOSIS — N184 Chronic kidney disease, stage 4 (severe): Secondary | ICD-10-CM | POA: Diagnosis present

## 2014-04-13 LAB — URIC ACID: Uric Acid, Serum: 6.3 mg/dL (ref 2.4–7.0)

## 2014-04-13 LAB — IRON AND TIBC
Iron: 47 ug/dL (ref 42–145)
Saturation Ratios: 17 % — ABNORMAL LOW (ref 20–55)
TIBC: 283 ug/dL (ref 250–470)
UIBC: 236 ug/dL (ref 125–400)

## 2014-04-13 LAB — BASIC METABOLIC PANEL
Anion gap: 9 (ref 5–15)
BUN: 25 mg/dL — ABNORMAL HIGH (ref 6–23)
CO2: 30 mmol/L (ref 19–32)
CREATININE: 1.94 mg/dL — AB (ref 0.50–1.10)
Calcium: 9.6 mg/dL (ref 8.4–10.5)
Chloride: 103 mmol/L (ref 96–112)
GFR calc non Af Amer: 23 mL/min — ABNORMAL LOW (ref >90)
GFR, EST AFRICAN AMERICAN: 26 mL/min — AB (ref >90)
Glucose, Bld: 96 mg/dL (ref 70–99)
POTASSIUM: 3.8 mmol/L (ref 3.5–5.1)
Sodium: 142 mmol/L (ref 135–145)

## 2014-04-13 LAB — URINALYSIS, ROUTINE W REFLEX MICROSCOPIC
Bilirubin Urine: NEGATIVE
GLUCOSE, UA: NEGATIVE mg/dL
Hgb urine dipstick: NEGATIVE
Ketones, ur: NEGATIVE mg/dL
Nitrite: NEGATIVE
PROTEIN: NEGATIVE mg/dL
Specific Gravity, Urine: 1.009 (ref 1.005–1.030)
Urobilinogen, UA: 0.2 mg/dL (ref 0.0–1.0)
pH: 7.5 (ref 5.0–8.0)

## 2014-04-13 LAB — URINE MICROSCOPIC-ADD ON

## 2014-04-13 LAB — CREATININE, URINE, RANDOM: Creatinine, Urine: 46.19 mg/dL

## 2014-04-13 LAB — SODIUM, URINE, RANDOM: Sodium, Ur: 71 mmol/L

## 2014-04-13 NOTE — Consult Note (Signed)
Reason for Consult:CKD 4,  Referring Physician: Dr. Earlie Raveling Vanessa [name] is an 81 y.o. female.  HPI: 81 yr female admitted with about 2 wk of intermittent SOB and CP . Ha prolonged CP yest so called EMS and taken to [facility], then here.  Hx of Stent n coronary in 2004.   Also prob with RP bleed in 2004 after cath.  Prob with ^ lipids, statin intolerance, thyroid nodules, multiple allergies (see list), obesity, ? HTN but no meds., arthritis of hips ( multiple injx), L ankle , R shoulder (replaced). Has BP ^ at home into 200s sys but this only a couple of times in past wk and cannot describe how but was assoc with SOB.  Otherwise her SOB is with housework, and bending over.  Takes Naproxen 1-2 times/wk, but a large number of OTC Vit, fish oils, melatonin, AZO with cranberry, etc.  Hx UTIs in past but cannot say has ever been tx.  Had toxemia with preg repeatedly, but cannot define complications.  Cr 1.4 in 2013 but 2.13 on admit and today 1.94.  Denies FH of renal disease, inherited ms skel, eye or hearing defects.  No hx stones.  Recent prob with R sided abdm pain w/u by Dr. Nyra Capes, including blood work and x-rays.  Not listed in surgical hx, has R shoulder surgery, removal of plate from L leg, Hemorrhoid surgery. Constitutional: no fevers, chills, cough, , N,V,D Eyes: negative Ears, nose, mouth, throat, and face: upper and lower plates Respiratory: negative Cardiovascular: see Hx Gastrointestinal: negative Genitourinary:noct x 3, no other sx Integument/breast: negative Hematologic/lymphatic: negative Musculoskeletal:as above Neurological: numb and pain L lat leg Allergic/Immunologic: see, above    Past Medical History  Diagnosis Date  . CAD (coronary artery disease) 03/2007    DES to LAD, January, 2009 / stress echo, July, 2009 elsewhere, no ischemia  . Multiple thyroid nodules     small heterogenious nodules...OK  . Retroperitoneal bleed     post cath 2004-surgery  . HTN  (hypertension)   . Dyslipidemia   . Bradycardia 05/2010    March, 2012  . SOB (shortness of breath) 05/2010    March, 2012  . Statin intolerance   . Preop cardiovascular exam     Cardiac clearance for shoulder surgery October, 2013  . Hemorrhoids 1981  . Wrist injury     plate placed [id number] in rt wrist & removed 2008  . Shingles 07/19/2010  . Shoulder injury 2013    right     Past Surgical History  Procedure Laterality Date  . Appendectomy  1949  . Tonsillectomy  1956  . Abdominal hysterectomy  1974  . Tibia fracture surgery  1995    rod placed   . Wrist fusion with iliac crest bone graft  2005  . Cataracts      lazer sx 2011 & 2012  . Eye surgery  2014    eye lid sx    Family History  Problem Relation Age of Onset  . Cancer    . Coronary artery disease      Social History:  reports that she has never smoked. She does not have any smokeless tobacco history on file. She reports that she does not drink alcohol or use illicit drugs.  Allergies:  Allergies  Allergen Reactions  . Bystolic USAA Hcl] Shortness Of Breath    SHORTNESS OF BREATH  . Ciprofloxacin Diarrhea  . Clindamycin Diarrhea  . Cephalexin Other (See Comments)  Red swollen face  . Codeine Nausea Only  . Hydrocodone-Acetaminophen Other (See Comments)    Hallucinations   . Oxycodone Hcl Other (See Comments)    Hallucinations  . Propoxyphene Hcl Itching  . Propoxyphene N-Acetaminophen Itching  . Statins Other (See Comments)    "Made arms feel like they weighted a ton"  . Tetanus Toxoid Other (See Comments)    Whelps   . Tramadol Nausea And Vomiting  . Levothyroxine Sodium Itching, Palpitations and Other (See Comments)    Headache   . Nitrofuran Derivatives Diarrhea, Palpitations and Other (See Comments)     dizziness   . Penicillins Rash  . Tetracycline Rash and Other (See Comments)    whelps    Medications:  I have reviewed the patient's current medications. Prior to Admission:   Prescriptions prior to admission  Medication Sig Dispense Refill Last Dose  . aspirin 81 MG tablet Take 81 mg by mouth daily.    Past Week at Unknown time  . B Complex-C (B-COMPLEX WITH VITAMIN C) tablet Take 1 tablet by mouth daily.     Past Week at Unknown time  . calcium citrate-vitamin D (CITRACAL+D) 315-200 MG-UNIT per tablet Take 2 tablets by mouth daily.    Past Week at Unknown time  . dicyclomine (BENTYL) 10 MG capsule Take 10 mg by mouth daily as needed for spasms.    Past Month at Unknown time  . diphenhydrAMINE (BENADRYL) 25 MG tablet Take 25 mg by mouth every 8 (eight) hours as needed for allergies.    couple months  . estradiol (ESTRACE) 0.5 MG tablet Take 0.5 mg by mouth every Monday, Wednesday, and Friday.   04/10/2014  . Flaxseed, Linseed, (FLAX SEED OIL PO) Take 1-2 tablets by mouth daily.    Past Week at Unknown time  . Glucosamine-Chondroitin (MOVE FREE PO) Take 1 tablet by mouth daily.   Past Week at Unknown time  . Magnesium 300 MG CAPS Take 600 mg by mouth daily.    Past Week at Unknown time  . Multiple Vitamin (MULTIVITAMIN) capsule Take 1 capsule by mouth daily. Alive   Past Week at Unknown time  . naproxen sodium (ANAPROX) 220 MG tablet Take 220 mg by mouth daily as needed (pain).    04/10/2014  . OVER THE COUNTER MEDICATION Take 2 tablets by mouth 2 (two) times daily. Omega XL   Past Week at Unknown time  . POTASSIUM PO Take 2 tablets by mouth daily.    Past Week at Unknown time  . Probiotic Product (PROBIOTIC DAILY PO) Take 1 tablet by mouth daily.   Past Week at Unknown time  . vitamin E 400 UNIT capsule Take 400 Units by mouth daily.   Past Week at Unknown time    Results for orders placed or performed during the hospital encounter of 04/11/14 (from the past 48 hour(s))  Lipid panel     Status: Abnormal   Collection Time: 04/12/14  1:20 AM  Result Value Ref Range   Cholesterol 191 0 - 200 mg/dL   Triglycerides 100 <150 mg/dL   HDL 40 >39 mg/dL   Total CHOL/HDL  Ratio 4.8 RATIO   VLDL 20 0 - 40 mg/dL   LDL Cholesterol 131 (H) 0 - 99 mg/dL    Comment:        Total Cholesterol/HDL:CHD Risk Coronary Heart Disease Risk Table                     Men  Women  1/2 Average Risk   3.4   3.3  Average Risk       5.0   4.4  2 X Average Risk   9.6   7.1  3 X Average Risk  23.4   11.0        Use the calculated Patient Ratio above and the CHD Risk Table to determine the patient's CHD Risk.        ATP III CLASSIFICATION (LDL):  <100     mg/dL   Optimal  100-129  mg/dL   Near or Above                    Optimal  130-159  mg/dL   Borderline  160-189  mg/dL   High  >190     mg/dL   Very High   CBC     Status: Abnormal   Collection Time: 04/12/14  1:20 AM  Result Value Ref Range   WBC 11.3 (H) 4.0 - 10.5 K/uL   RBC 3.61 (L) 3.87 - 5.11 MIL/uL   Hemoglobin 11.4 (L) 12.0 - 15.0 g/dL   HCT 35.6 (L) 36.0 - 46.0 %   MCV 98.6 78.0 - 100.0 fL   MCH 31.6 26.0 - 34.0 pg   MCHC 32.0 30.0 - 36.0 g/dL   RDW 13.1 11.5 - 15.5 %   Platelets 205 150 - 400 K/uL  Creatinine, serum     Status: Abnormal   Collection Time: 04/12/14  1:20 AM  Result Value Ref Range   Creatinine, Ser 2.36 (H) 0.50 - 1.10 mg/dL   GFR calc non Af Amer 18 (L) >90 mL/min   GFR calc Af Amer 21 (L) >90 mL/min    Comment: (NOTE) The eGFR has been calculated using the CKD EPI equation. This calculation has not been validated in all clinical situations. eGFR's persistently <90 mL/min signify possible Chronic Kidney Disease.   Troponin I-(serum)     Status: Abnormal   Collection Time: 04/12/14  1:20 AM  Result Value Ref Range   Troponin I 0.08 (H) <0.031 ng/mL    Comment:        PERSISTENTLY INCREASED TROPONIN VALUES IN THE RANGE OF 0.04-0.49 ng/mL CAN BE SEEN IN:       -UNSTABLE ANGINA       -CONGESTIVE HEART FAILURE       -MYOCARDITIS       -CHEST TRAUMA       -ARRYHTHMIAS       -LATE PRESENTING MYOCARDIAL INFARCTION       -COPD   CLINICAL FOLLOW-UP RECOMMENDED.   TSH      Status: Abnormal   Collection Time: 04/12/14  3:46 AM  Result Value Ref Range   TSH 6.722 (H) 0.350 - 4.500 uIU/mL  Troponin I-(serum)     Status: Abnormal   Collection Time: 04/12/14  7:58 AM  Result Value Ref Range   Troponin I 0.07 (H) <0.031 ng/mL    Comment:        PERSISTENTLY INCREASED TROPONIN VALUES IN THE RANGE OF 0.04-0.49 ng/mL CAN BE SEEN IN:       -UNSTABLE ANGINA       -CONGESTIVE HEART FAILURE       -MYOCARDITIS       -CHEST TRAUMA       -ARRYHTHMIAS       -LATE PRESENTING MYOCARDIAL INFARCTION       -COPD   CLINICAL FOLLOW-UP RECOMMENDED.   Basic metabolic panel  Status: Abnormal   Collection Time: 04/12/14  7:58 AM  Result Value Ref Range   Sodium 141 135 - 145 mmol/L   Potassium 3.8 3.5 - 5.1 mmol/L   Chloride 103 96 - 112 mmol/L   CO2 32 19 - 32 mmol/L   Glucose, Bld 90 70 - 99 mg/dL   BUN 25 (H) 6 - 23 mg/dL   Creatinine, Ser 2.14 (H) 0.50 - 1.10 mg/dL   Calcium 9.9 8.4 - 10.5 mg/dL   GFR calc non Af Amer 20 (L) >90 mL/min   GFR calc Af Amer 23 (L) >90 mL/min    Comment: (NOTE) The eGFR has been calculated using the CKD EPI equation. This calculation has not been validated in all clinical situations. eGFR's persistently <90 mL/min signify possible Chronic Kidney Disease.    Anion gap 6 5 - 15  CBC     Status: Abnormal   Collection Time: 04/12/14  7:58 AM  Result Value Ref Range   WBC 9.6 4.0 - 10.5 K/uL   RBC 3.77 (L) 3.87 - 5.11 MIL/uL   Hemoglobin 12.2 12.0 - 15.0 g/dL   HCT 37.3 36.0 - 46.0 %   MCV 98.9 78.0 - 100.0 fL   MCH 32.4 26.0 - 34.0 pg   MCHC 32.7 30.0 - 36.0 g/dL   RDW 13.2 11.5 - 15.5 %   Platelets 205 150 - 400 K/uL  Troponin I-(serum)     Status: Abnormal   Collection Time: 04/12/14 12:15 PM  Result Value Ref Range   Troponin I 0.06 (H) <0.031 ng/mL    Comment:        PERSISTENTLY INCREASED TROPONIN VALUES IN THE RANGE OF 0.04-0.49 ng/mL CAN BE SEEN IN:       -UNSTABLE ANGINA       -CONGESTIVE HEART FAILURE        -MYOCARDITIS       -CHEST TRAUMA       -ARRYHTHMIAS       -LATE PRESENTING MYOCARDIAL INFARCTION       -COPD   CLINICAL FOLLOW-UP RECOMMENDED.   Basic metabolic panel     Status: Abnormal   Collection Time: 04/13/14  7:35 AM  Result Value Ref Range   Sodium 142 135 - 145 mmol/L   Potassium 3.8 3.5 - 5.1 mmol/L   Chloride 103 96 - 112 mmol/L   CO2 30 19 - 32 mmol/L   Glucose, Bld 96 70 - 99 mg/dL   BUN 25 (H) 6 - 23 mg/dL   Creatinine, Ser 1.94 (H) 0.50 - 1.10 mg/dL   Calcium 9.6 8.4 - 10.5 mg/dL   GFR calc non Af Amer 23 (L) >90 mL/min   GFR calc Af Amer 26 (L) >90 mL/min    Comment: (NOTE) The eGFR has been calculated using the CKD EPI equation. This calculation has not been validated in all clinical situations. eGFR's persistently <90 mL/min signify possible Chronic Kidney Disease.    Anion gap 9 5 - 15    Nm Myocar Multi W/spect W/wall Motion / Ef  04/12/2014   CLINICAL DATA:  Chest pain, coronary artery disease, hypertension, dyslipidemia, bradycardia and statin intolerance.  EXAM: MYOCARDIAL IMAGING WITH SPECT (REST AND PHARMACOLOGIC-STRESS)  GATED LEFT VENTRICULAR WALL MOTION STUDY  LEFT VENTRICULAR EJECTION FRACTION  TECHNIQUE: Standard myocardial SPECT imaging was performed after resting intravenous injection of 10 mCi Tc-66msestamibi. Subsequently, intravenous infusion of Lexiscan was performed under the supervision of the Cardiology staff. At peak effect of the drug,  30 mCi Tc-53msestamibi was injected intravenously and standard myocardial SPECT imaging was performed. Quantitative gated imaging was also performed to evaluate left ventricular wall motion, and estimate left ventricular ejection fraction.  COMPARISON:  None.  FINDINGS: Perfusion: No reversible ischemia identified. Moderate to large fixed defect involving the anterior wall is noted with out wall motion abnormality.  Wall Motion: Normal left ventricular wall motion. No left ventricular dilation.  Left Ventricular  Ejection Fraction: 73 %  End diastolic volume 65 ml  End systolic volume 18 ml  IMPRESSION: 1. No reversible ischemia. Fixed defect involves the anterior wall with al wall motion abnormality.  2. Normal left ventricular wall motion.  3. Left ventricular ejection fraction 73%  4. Low-risk stress test findings*.  *2012 Appropriate Use Criteria for Coronary Revascularization Focused Update: J Am Coll Cardiol. 28921;19(4):174-081 http://content.oairportbarriers.comaspx?articleid=1201161   Electronically Signed   By: TKerby MoorsM.D.   On: 04/12/2014 12:00    ROS Blood pressure 141/41, pulse 48, temperature 97.8 F (36.6 C), temperature source Oral, resp. rate 18, height 5' 4"  (1.626 m), weight 73.3 kg (161 lb 9.6 oz), SpO2 93 %. Physical Exam Physical Examination: General appearance - alert, well appearing, and in no distress, overweight and pale Mental status - alert, oriented to person, place, and time, normal mood, behavior, speech, dress, motor activity, and thought processes Eyes - pupils equal and reactive, extraocular eye movements intact, funduscopic exam normal, discs flat and sharp Mouth - mucous membranes moist, pharynx normal without lesions and upper and lower plates Neck - adenopathy noted PCL, , nodular thyroid Lymphatics - posterior cervical nodes Chest - clear to auscultation, no wheezes, rales or rhonchi, symmetric air entry Heart - S1 and S2 normal, systolic murmur GKG8/1at apex Abdomen - soft, nontender, nondistended, no masses or organomegaly Liver down 4 cm Neurological - alert, oriented, normal speech, no focal findings or movement disorder noted, screening mental status exam normal, cranial nerves II through XII intact, funduscopic exam normal, discs flat and sharp, motor and sensory grossly normal bilaterally Musculoskeletal - no joint tenderness, deformity or swelling Extremities - no pedal edema noted, peripheral pulses abnormal DP 1+ Skin - thin, bruises,  pale  Assessment/Plan: 1 CKD 4 some improvement overnight.  Biggest risks are the meds esp OTC that she takes. Multiple episodes toxemia predispose to CKD also. Clearly has GFR too low for age and suspect toxic injury.  Need to eval anatomy and urine sediment first , secondary complications, and keep off her OTCs including NSAIDS.   Do not feel this is just BP however with volatility may have RAS but not typical presentation. 2 CP per Cards 3 Hypertension: not really clear if present or is just assoc with CP.  Will consider RAS.  Clearly has assoc with some meds in past (sympathomimetics) 4. Anemia mild 5. Metabolic Bone Disease: eval 6 Polypharmacy 7 DJD 8 ^ lipids P U/s, UA, Urine chem, Uric acid, PTH, keep off home OTCs,    Eldean Nanna L 04/13/2014, 3:07 PM

## 2014-04-13 NOTE — Progress Notes (Addendum)
Subjective: No further CP  Objective: Vital signs in last 24 hours: Temp:  [97.7 F (36.5 C)-97.8 F (36.6 C)] 97.8 F (36.6 C) (01/25 0602) Pulse Rate:  [44-48] 48 (01/25 0602) Resp:  [16-19] 18 (01/25 0602) BP: (116-162)/(43-62) 116/55 mmHg (01/25 0602) SpO2:  [96 %-97 %] 97 % (01/25 0602) Weight:  [161 lb 9.6 oz (73.3 kg)] 161 lb 9.6 oz (73.3 kg) (01/25 0602) Last BM Date: 04/11/14  Intake/Output from previous day: 01/24 0701 - 01/25 0700 In: 240 [P.O.:240] Out: 450 [Urine:450] Intake/Output this shift: Total I/O In: -  Out: 300 [Urine:300]  Medications Current Facility-Administered Medications  Medication Dose Route Frequency Provider Last Rate Last Dose  . alum & mag hydroxide-simeth (MAALOX/MYLANTA) 200-200-20 MG/5ML suspension 30 mL  30 mL Oral PRN Josue Hector, MD   30 mL at 04/12/14 2229  . aspirin EC tablet 81 mg  81 mg Oral Daily Inez Pilgrim, MD   81 mg at 04/12/14 1301  . dicyclomine (BENTYL) capsule 10 mg  10 mg Oral Q8H PRN Inez Pilgrim, MD      . heparin injection 5,000 Units  5,000 Units Subcutaneous 3 times per day Inez Pilgrim, MD   5,000 Units at 04/12/14 2202  . naproxen (NAPROSYN) tablet 250 mg  250 mg Oral BID PRN Josue Hector, MD   250 mg at 04/12/14 2048  . nitroGLYCERIN (NITROSTAT) SL tablet 0.4 mg  0.4 mg Sublingual Q5 Min x 3 PRN Inez Pilgrim, MD      . ondansetron [facility]) injection 4 mg  4 mg Intravenous Q6H PRN Inez Pilgrim, MD        PE: General appearance: alert, cooperative and no distress Lungs: clear to auscultation bilaterally Heart: Reg rhythm.  Rate slow.  No MM Extremities: No LEE Pulses: 2+ and symmetric Skin: Warm and dry Neurologic: Grossly normal  Lab Results:   Recent Labs  04/12/14 0120 04/12/14 0758  WBC 11.3* 9.6  HGB 11.4* 12.2  HCT 35.6* 37.3  PLT 205 205   BMET  Recent Labs  04/12/14 0120 04/12/14 0758  NA  --  141  K  --  3.8  CL  --  103  CO2  --  32  GLUCOSE  --  90  BUN   --  25*  CREATININE 2.36* 2.14*  CALCIUM  --  9.9   PT/INR No results for input(s): LABPROT, INR in the last 72 hours. Cholesterol  Recent Labs  04/12/14 0120  CHOL 191   Lipid Panel     Component Value Date/Time   CHOL 191 04/12/2014 0120   TRIG 100 04/12/2014 0120   HDL 40 04/12/2014 0120   CHOLHDL 4.8 04/12/2014 0120   VLDL 20 04/12/2014 0120   LDLCALC 131* 04/12/2014 0120   Cardiac Panel (last 3 results)  Recent Labs  04/12/14 0120 04/12/14 0758 04/12/14 1215  TROPONINI 0.08* 0.07* 0.06*       Assessment/Plan  Principal Problem:   Chest pain Active Problems:   CAD (coronary artery disease)   HTN (hypertension)   Dyslipidemia   Bradycardia   Statin intolerance   Elevated troponin   Acute on chronic CKD stage 3  Plan:   No further CP.  Troponin peaked at 0.08.  Likely from HTN urgency.  BP is better and interestingly she is on no BP meds.  Lexiscan negative. Chronic bradycardia. 40-60.  Appears stable.  No beta blocker.  She does have frequent PVCs and when occuring during bradycardia  the pause is up to 2.6 sec.  She's been feeling lightheaded for a couple weeks.  May need OP Cardionet.   Statin intolerant.  Last BMET before admission was two years ago and SCr was 1.4 with a GFR of 39.59.  Now SCr is 2.14/20.  ? Embolism to kidney(sudden spike in BP and worsen SCr?).  Will check renal US today.  Echo pending.  Will need nephrology consult or OP follow up.  She reports drinking lots of water.    Could DC home later.  Ambulate this morning.   LOS: 2 days    HAGER, BRYAN PA-C 04/13/2014 6:44 AM   Agree with note written by Luisa Dago PAC  No further CP or episodes of HTN. H/O Stent by [facility] 2009. CP was somewhat similar and CP resolved with SL/Topical nitrate. Enz borderline +.Myoview nl , 2D nl. Agree with Renal artery doppler and renal consult. Probably home tomorrow. May also need OP event monitor for bradycardia   Vanessa [name]  [name] 04/13/2014 1:00 PM

## 2014-04-13 NOTE — Care Management Note (Unsigned)
    Page 1 of 1   04/13/2014     3:30:58 PM CARE MANAGEMENT NOTE 04/13/2014  Patient:  Vanessa [name], Vanessa [name]   Account Number:  [id number]  Date Initiated:  04/13/2014  Documentation initiated by:  GRAVES-BIGELOW,Lucas Winograd  Subjective/Objective Assessment:   Pt admitted for cp as a transfer from [facility]     Action/Plan:   CM to monitor for disposition needs.   Anticipated DC Date:  04/14/2014   Anticipated DC Plan:  [place]  CM consult      Choice offered to / List presented to:             Status of service:  In process, will continue to follow Medicare Important Message given?   (If response is "NO", the following Medicare IM given date fields will be blank) Date Medicare IM given:   Medicare IM given by:   Date Additional Medicare IM given:   Additional Medicare IM given by:    Discharge Disposition:    Per UR Regulation:  Reviewed for med. necessity/level of care/duration of stay  If discussed at [place] of Stay Meetings, dates discussed:    Comments:

## 2014-04-13 NOTE — Progress Notes (Signed)
  Echocardiogram 2D Echocardiogram has been performed.  Vanessa [name] 04/13/2014, 9:40 AM

## 2014-04-13 NOTE — Progress Notes (Signed)
UR Completed Chanz Cahall Graves-Bigelow, RN,BSN [phone]  

## 2014-04-14 ENCOUNTER — Inpatient Hospital Stay (HOSPITAL_COMMUNITY): Payer: Medicare Other

## 2014-04-14 ENCOUNTER — Other Ambulatory Visit: Payer: Self-pay | Admitting: Cardiology

## 2014-04-14 DIAGNOSIS — R7989 Other specified abnormal findings of blood chemistry: Secondary | ICD-10-CM

## 2014-04-14 DIAGNOSIS — R778 Other specified abnormalities of plasma proteins: Secondary | ICD-10-CM | POA: Diagnosis present

## 2014-04-14 DIAGNOSIS — N184 Chronic kidney disease, stage 4 (severe): Secondary | ICD-10-CM

## 2014-04-14 DIAGNOSIS — Z889 Allergy status to unspecified drugs, medicaments and biological substances status: Secondary | ICD-10-CM

## 2014-04-14 DIAGNOSIS — R531 Weakness: Secondary | ICD-10-CM

## 2014-04-14 DIAGNOSIS — I10 Essential (primary) hypertension: Secondary | ICD-10-CM

## 2014-04-14 DIAGNOSIS — Z9861 Coronary angioplasty status: Secondary | ICD-10-CM

## 2014-04-14 DIAGNOSIS — R001 Bradycardia, unspecified: Secondary | ICD-10-CM

## 2014-04-14 LAB — MAGNESIUM: Magnesium: 2.3 mg/dL (ref 1.5–2.5)

## 2014-04-14 LAB — CBC
HCT: 34 % — ABNORMAL LOW (ref 36.0–46.0)
Hemoglobin: 11.2 g/dL — ABNORMAL LOW (ref 12.0–15.0)
MCH: 32.3 pg (ref 26.0–34.0)
MCHC: 32.9 g/dL (ref 30.0–36.0)
MCV: 98 fL (ref 78.0–100.0)
Platelets: 207 10*3/uL (ref 150–400)
RBC: 3.47 MIL/uL — AB (ref 3.87–5.11)
RDW: 13.1 % (ref 11.5–15.5)
WBC: 9.3 10*3/uL (ref 4.0–10.5)

## 2014-04-14 LAB — COMPREHENSIVE METABOLIC PANEL
ALT: 10 U/L (ref 0–35)
ANION GAP: 9 (ref 5–15)
AST: 17 U/L (ref 0–37)
Albumin: 3.2 g/dL — ABNORMAL LOW (ref 3.5–5.2)
Alkaline Phosphatase: 48 U/L (ref 39–117)
BUN: 28 mg/dL — AB (ref 6–23)
CO2: 28 mmol/L (ref 19–32)
Calcium: 9.2 mg/dL (ref 8.4–10.5)
Chloride: 104 mmol/L (ref 96–112)
Creatinine, Ser: 1.87 mg/dL — ABNORMAL HIGH (ref 0.50–1.10)
GFR calc Af Amer: 27 mL/min — ABNORMAL LOW (ref >90)
GFR calc non Af Amer: 24 mL/min — ABNORMAL LOW (ref >90)
GLUCOSE: 113 mg/dL — AB (ref 70–99)
Potassium: 4.1 mmol/L (ref 3.5–5.1)
Sodium: 141 mmol/L (ref 135–145)
Total Bilirubin: 0.6 mg/dL (ref 0.3–1.2)
Total Protein: 5.8 g/dL — ABNORMAL LOW (ref 6.0–8.3)

## 2014-04-14 LAB — PHOSPHORUS: Phosphorus: 2.8 mg/dL (ref 2.3–4.6)

## 2014-04-14 LAB — PARATHYROID HORMONE, INTACT (NO CA): PTH: 23 pg/mL (ref 15–65)

## 2014-04-14 MED ORDER — NITROGLYCERIN 0.4 MG SL SUBL: 0.4000 mg | SUBLINGUAL_TABLET | SUBLINGUAL | Status: DC | PRN

## 2014-04-14 MED ORDER — AMLODIPINE BESYLATE 5 MG PO TABS: 5.0000 mg | ORAL_TABLET | Freq: Every day | ORAL | Status: DC

## 2014-04-14 MED ORDER — AMLODIPINE BESYLATE 5 MG PO TABS
5.0000 mg | ORAL_TABLET | Freq: Every day | ORAL | Status: DC
  Administered 2014-04-14: 5 mg via ORAL
  Filled 2014-04-14: qty 1

## 2014-04-14 NOTE — Discharge Instructions (Signed)
Chronic Kidney Disease °Chronic kidney disease occurs when the kidneys are damaged over a long period. The kidneys are two organs that lie on either side of the spine between the middle of the back and the front of the abdomen. The kidneys:  °· Remove wastes and extra water from the blood.   °· Produce important hormones. These help keep bones strong, regulate blood pressure, and help create red blood cells.   °· Balance the fluids and chemicals in the blood and tissues. °A small amount of kidney damage may not cause problems, but a large amount of damage may make it difficult or impossible for the kidneys to work the way they should. If steps are not taken to slow down the kidney damage or stop it from getting worse, the kidneys may stop working permanently. Most of the time, chronic kidney disease does not go away. However, it can often be controlled, and those with the disease can usually live normal lives. °CAUSES  °The most common causes of chronic kidney disease are diabetes and high blood pressure (hypertension). Chronic kidney disease may also be caused by:  °· Diseases that cause the kidneys' filters to become inflamed.   °· Diseases that affect the immune system.   °· Genetic diseases.   °· Medicines that damage the kidneys, such as anti-inflammatory medicines.   °· Poisoning or exposure to toxic substances.   °· A reoccurring kidney or urinary infection.   °· A problem with urine flow. This may be caused by:   °¨ Cancer.   °¨ Kidney stones.   °¨ An enlarged prostate in males. °SIGNS AND SYMPTOMS  °Because the kidney damage in chronic kidney disease occurs slowly, symptoms develop slowly and may not be obvious until the kidney damage becomes severe. A person may have a kidney disease for years without showing any symptoms. Symptoms can include:  °· Swelling (edema) of the legs, ankles, or feet.   °· Tiredness (lethargy).   °· Nausea or vomiting.   °· Confusion.   °· Problems with urination, such as:    °¨ Decreased urine production.   °¨ Frequent urination, especially at night.   °¨ Frequent accidents in children who are potty trained.   °· Muscle twitches and cramps.   °· Shortness of breath.  °· Weakness.   °· Persistent itchiness.   °· Loss of appetite. °· Metallic taste in the mouth. °· Trouble sleeping. °· Slowed development in children. °· Short stature in children. °DIAGNOSIS  °Chronic kidney disease may be detected and diagnosed by tests, including blood, urine, imaging, or kidney biopsy tests.  °TREATMENT  °Most chronic kidney diseases cannot be cured. Treatment usually involves relieving symptoms and preventing or slowing the progression of the disease. Treatment may include:  °· A special diet. You may need to avoid alcohol and foods that are salty and high in potassium.   °· Medicines. These may:   °¨ Lower blood pressure.   °¨ Relieve anemia.   °¨ Relieve swelling.   °¨ Protect the bones. °HOME CARE INSTRUCTIONS  °· Follow your prescribed diet.   °· Take medicines only as directed by your health care provider. Do not take any new medicines (prescription, over-the-counter, or nutritional supplements) unless approved by your health care provider. Many medicines can worsen your kidney damage or need to have the dose adjusted.   °· Quit smoking if you smoke. Talk to your health care provider about a smoking cessation program.   °· Keep all follow-up visits as directed by your health care provider. °SEEK IMMEDIATE MEDICAL CARE IF: °· Your symptoms get worse or you develop new symptoms.   °· You develop symptoms of end-stage kidney disease. These   include:   Headaches.   Abnormally dark or light skin.   Numbness in the hands or feet.   Easy bruising.   Frequent hiccups.   Menstruation stops.   You have a fever.   You have decreased urine production.   You havepain or bleeding when urinating. MAKE SURE YOU:  Understand these instructions.  Will watch your condition.  Will  get help right away if you are not doing well or get worse. FOR MORE INFORMATION   American Association of Kidney Patients: BombTimer.gl  National Kidney Foundation: www.kidney.[place]: https://mathis.com/  [facility]: www.lifeoptions.org and www.kidneyschool.org Document Released: 12/14/2007 Document Revised: 07/21/2013 Document Reviewed: 11/03/2011 [facility] Patient Information 2015 [place], [place]. This information is not intended to replace advice given to you by your health care provider. Make sure you discuss any questions you have with your health care provider. Hypertension Hypertension is another name for high blood pressure. High blood pressure forces your heart to work harder to pump blood. A blood pressure reading has two numbers, which includes a higher number over a lower number (example: 110/72). HOME CARE   Have your blood pressure rechecked by your doctor.  Only take medicine as told by your doctor. Follow the directions carefully. The medicine does not work as well if you skip doses. Skipping doses also puts you at risk for problems.  Do not smoke.  Monitor your blood pressure at home as told by your doctor. GET HELP IF:  You think you are having a reaction to the medicine you are taking.  You have repeat headaches or feel dizzy.  You have puffiness (swelling) in your ankles.  You have trouble with your vision. GET HELP RIGHT AWAY IF:   You get a very bad headache and are confused.  You feel weak, numb, or faint.  You get chest or belly (abdominal) pain.  You throw up (vomit).  You cannot breathe very well. MAKE SURE YOU:   Understand these instructions.  Will watch your condition.  Will get help right away if you are not doing well or get worse. Document Released: 08/23/2007 Document Revised: 03/11/2013 Document Reviewed: 12/27/2012 [facility] Patient Information 2015 [place], [place]. This information is not  intended to replace advice given to you by your health care provider. Make sure you discuss any questions you have with your health care provider.

## 2014-04-14 NOTE — Progress Notes (Signed)
Subjective: Interval History: has no complaint, would like to go home.  Objective: Vital signs in last 24 hours: Temp:  [98 F (36.7 C)-98.2 F (36.8 C)] 98.2 F (36.8 C) (01/26 0609) Pulse Rate:  [47-49] 47 (01/26 0609) Resp:  [18] 18 (01/26 0609) BP: (141-157)/(41-70) 145/70 mmHg (01/26 0609) SpO2:  [93 %-100 %] 100 % (01/26 0609) Weight:  [73.1 kg (161 lb 2.5 oz)] 73.1 kg (161 lb 2.5 oz) (01/26 0609) Weight change: -0.2 kg (-7.1 oz)  Intake/Output from previous day: 01/25 0701 - 01/26 0700 In: 240 [P.O.:240] Out: 1475 [Urine:1475] Intake/Output this shift:    General appearance: alert, cooperative, moderately obese and pale Resp: clear to auscultation bilaterally Cardio: S1, S2 normal and systolic murmur: holosystolic 2/6, blowing at apex GI: pos bs, soft, liver down 4 cm Extremities: DP 1+  Lab Results:  Recent Labs  04/12/14 0758 04/14/14 0415  WBC 9.6 9.3  HGB 12.2 11.2*  HCT 37.3 34.0*  PLT 205 207   BMET:  Recent Labs  04/13/14 0735 04/14/14 0415  NA 142 141  K 3.8 4.1  CL 103 104  CO2 30 28  GLUCOSE 96 113*  BUN 25* 28*  CREATININE 1.94* 1.87*  CALCIUM 9.6 9.2    Recent Labs  04/13/14 1545  PTH 23   Iron Studies:  Recent Labs  04/13/14 1545  IRON 47  TIBC 283    Studies/Results: US Renal  04/13/2014   CLINICAL DATA:  81 year old female with acute renal failure serve from post on chronic renal disease. Initial encounter. History of hypertension.  EXAM: RENAL/URINARY TRACT ULTRASOUND COMPLETE  COMPARISON:  None.  FINDINGS: Right Kidney:  Length: 8.6 cm. Renal echogenicity is upper limits of normal. Cortical thinning is present. There is no evidence of hydronephrosis or solid mass.  Left Kidney:  Length: 9.4 cm. Renal echogenicity is upper limits of normal. Cortical thinning is present. There is no evidence of hydronephrosis or solid mass.  Bladder:  Appears normal for degree of bladder distention.  IMPRESSION: Small kidneys bilaterally with  upper limits of normal renal echogenicity and cortical thinning -compatible with medical renal disease. No evidence of hydronephrosis.   Electronically Signed   By: Hassan Rowan M.D.   On: 04/13/2014 18:57   Nm Myocar Multi W/spect W/wall Motion / Ef  04/12/2014   CLINICAL DATA:  Chest pain, coronary artery disease, hypertension, dyslipidemia, bradycardia and statin intolerance.  EXAM: MYOCARDIAL IMAGING WITH SPECT (REST AND PHARMACOLOGIC-STRESS)  GATED LEFT VENTRICULAR WALL MOTION STUDY  LEFT VENTRICULAR EJECTION FRACTION  TECHNIQUE: Standard myocardial SPECT imaging was performed after resting intravenous injection of 10 mCi Tc-49m sestamibi. Subsequently, intravenous infusion of Lexiscan was performed under the supervision of the Cardiology staff. At peak effect of the drug, 30 mCi Tc-41m sestamibi was injected intravenously and standard myocardial SPECT imaging was performed. Quantitative gated imaging was also performed to evaluate left ventricular wall motion, and estimate left ventricular ejection fraction.  COMPARISON:  None.  FINDINGS: Perfusion: No reversible ischemia identified. Moderate to large fixed defect involving the anterior wall is noted with out wall motion abnormality.  Wall Motion: Normal left ventricular wall motion. No left ventricular dilation.  Left Ventricular Ejection Fraction: 73 %  End diastolic volume 65 ml  End systolic volume 18 ml  IMPRESSION: 1. No reversible ischemia. Fixed defect involves the anterior wall with al wall motion abnormality.  2. Normal left ventricular wall motion.  3. Left ventricular ejection fraction 73%  4. Low-risk stress test findings*.  *  2012 Appropriate Use Criteria for Coronary Revascularization Focused Update: J Am Coll Cardiol. 5183;43(7):357-897. http://content.airportbarriers.com.aspx?articleid=1201161   Electronically Signed   By: Kerby Moors M.D.   On: 04/12/2014 12:00    I have reviewed the patient's current  medications.  Assessment/Plan: 1 CKD with some component AKI. Cr slowly better. U/S small kidneys with scarring.  Suspect toxic and nephrosclerosis.  Needs Dopplers but can be done as outpatient.  Sediment and urine chem ok.  Suspect xs OTC meds with toxic effect 2 Anemia check Fe 3 Obesity 4 ^ lipids   5 CAD 6 DJD 7 Thyroid nodules P Agree with CXR, will order Dopplers, will f/u as outpatient.    LOS: 3 days   Vanessa [name] L 04/14/2014,9:22 AM

## 2014-04-14 NOTE — Discharge Summary (Signed)
Patient ID: Vanessa [name],  MRN: [id number], DOB/AGE: 79/05/1927 79 y.o.  Admit date: 04/11/2014 Discharge date: 04/14/2014  Primary Care Provider: Marco Collie, MD Primary Cardiologist: Dr Ron Parker  Discharge Diagnoses Principal Problem:   Chest pain with moderate risk of acute coronary syndrome Active Problems:   CAD S/P LAD DES '09. Myoview low risk 04/12/14   Troponin level elevated   HTN (hypertension)   Bradycardia   CKD (chronic kidney disease) stage 4, GFR 15-29 ml/min   Dyslipidemia   Statin intolerance    Hospital Course: 81 y/o followed by Dr Ron Parker with known CAD s/p DES to LAD in 2009, HTN (not on anti-HTN medications), bradycardia (chronic ) who was transferred from [facility] 04/11/14 after she presented there with CP and SOB. She notes her B/P at has been elevated, >128 systolic. She has had fatigue but no syncope. On arrival here she had bradycardia at 58. SCr was 2.36. Slight Troponin elevation of 0.08, felt to be secondary to HTN and acute on chronic renal insufficiency.  Myoview was low risk. Echo showed an EF of 60% with mild LAE and PA pressure of 40 mmHg. She was seen in consult by Dr Deterding who noted the pt was on multiple OTC medications as well as NSAIDs. These were all stopped. Norvasc was added for her B/P. Her SCr gradually improved and her B/P improved as well. Renal US showed small kidneys, she will need renal artery [place] as an OP. She will follow up with Dr Deterding and Dr Ron Parker as an OP. We did order an 2 week event monitor as she had bradycardia on telemetry and has had generalized weakness recently. There was also a concern for AO dissection. CTA was not able to be done secondary to renal issues. A CXR did not suggest a significantly widened mediastinum.   Discharge Vitals:  Blood pressure 190/53, pulse 47, temperature 98.2 F (36.8 C), temperature source Oral, resp. rate 18, height 5\' 4"  (1.626 m), weight 161 lb 2.5 oz (73.1 kg), SpO2 100 %.     Labs: Results for orders placed or performed during the hospital encounter of 04/11/14 (from the past 24 hour(s))  Iron and TIBC     Status: Abnormal   Collection Time: 04/13/14  3:45 PM  Result Value Ref Range   Iron 47 42 - 145 ug/dL   TIBC 283 250 - 470 ug/dL   Saturation Ratios 17 (L) 20 - 55 %   UIBC 236 125 - 400 ug/dL  Parathyroid hormone, intact (no Ca)     Status: None   Collection Time: 04/13/14  3:45 PM  Result Value Ref Range   PTH 23 15 - 65 pg/mL  Uric acid     Status: None   Collection Time: 04/13/14  3:45 PM  Result Value Ref Range   Uric Acid, Serum 6.3 2.4 - 7.0 mg/dL  Sodium, urine, random     Status: None   Collection Time: 04/13/14  4:02 PM  Result Value Ref Range   Sodium, Ur 71 mmol/L  Creatinine, urine, random     Status: None   Collection Time: 04/13/14  4:02 PM  Result Value Ref Range   Creatinine, Urine 46.19 mg/dL  Urinalysis, Routine w reflex microscopic     Status: Abnormal   Collection Time: 04/13/14  4:02 PM  Result Value Ref Range   Color, Urine YELLOW YELLOW   APPearance CLEAR CLEAR   Specific Gravity, Urine 1.009 1.005 - 1.030  pH 7.5 5.0 - 8.0   Glucose, UA NEGATIVE NEGATIVE mg/dL   Hgb urine dipstick NEGATIVE NEGATIVE   Bilirubin Urine NEGATIVE NEGATIVE   Ketones, ur NEGATIVE NEGATIVE mg/dL   Protein, ur NEGATIVE NEGATIVE mg/dL   Urobilinogen, UA 0.2 0.0 - 1.0 mg/dL   Nitrite NEGATIVE NEGATIVE   Leukocytes, UA SMALL (A) NEGATIVE  Urine microscopic-add on     Status: Abnormal   Collection Time: 04/13/14  4:02 PM  Result Value Ref Range   Squamous Epithelial / LPF RARE RARE   WBC, UA 0-2 <3 WBC/hpf   RBC / HPF 0-2 <3 RBC/hpf   Bacteria, UA FEW (A) RARE  CBC     Status: Abnormal   Collection Time: 04/14/14  4:15 AM  Result Value Ref Range   WBC 9.3 4.0 - 10.5 K/uL   RBC 3.47 (L) 3.87 - 5.11 MIL/uL   Hemoglobin 11.2 (L) 12.0 - 15.0 g/dL   HCT 34.0 (L) 36.0 - 46.0 %   MCV 98.0 78.0 - 100.0 fL   MCH 32.3 26.0 - 34.0 pg    MCHC 32.9 30.0 - 36.0 g/dL   RDW 13.1 11.5 - 15.5 %   Platelets 207 150 - 400 K/uL  Phosphorus     Status: None   Collection Time: 04/14/14  4:15 AM  Result Value Ref Range   Phosphorus 2.8 2.3 - 4.6 mg/dL  Comprehensive metabolic panel     Status: Abnormal   Collection Time: 04/14/14  4:15 AM  Result Value Ref Range   Sodium 141 135 - 145 mmol/L   Potassium 4.1 3.5 - 5.1 mmol/L   Chloride 104 96 - 112 mmol/L   CO2 28 19 - 32 mmol/L   Glucose, Bld 113 (H) 70 - 99 mg/dL   BUN 28 (H) 6 - 23 mg/dL   Creatinine, Ser 1.87 (H) 0.50 - 1.10 mg/dL   Calcium 9.2 8.4 - 10.5 mg/dL   Total Protein 5.8 (L) 6.0 - 8.3 g/dL   Albumin 3.2 (L) 3.5 - 5.2 g/dL   AST 17 0 - 37 U/L   ALT 10 0 - 35 U/L   Alkaline Phosphatase 48 39 - 117 U/L   Total Bilirubin 0.6 0.3 - 1.2 mg/dL   GFR calc non Af Amer 24 (L) >90 mL/min   GFR calc Af Amer 27 (L) >90 mL/min   Anion gap 9 5 - 15  Magnesium     Status: None   Collection Time: 04/14/14  4:15 AM  Result Value Ref Range   Magnesium 2.3 1.5 - 2.5 mg/dL    Disposition:  Follow-up Information    Follow up with Dola Argyle, MD.   Specialty:  Cardiology   Why:  Office will contact you   Contact information:   [address] [place] [address] [phone]       Follow up with DETERDING,JAMES L, MD.   Specialty:  Nephrology   Why:  as directed   Contact information:   [place]  [address] [phone]       Discharge Medications:    Medication List    STOP taking these medications        B-complex with vitamin C tablet     calcium citrate-vitamin D 315-200 MG-UNIT per tablet  Commonly known as:  CITRACAL+D     FLAX SEED OIL PO     Magnesium 300 MG Caps     MOVE FREE PO     multivitamin  capsule     naproxen sodium 220 MG tablet  Commonly known as:  ANAPROX     OVER THE COUNTER MEDICATION     POTASSIUM PO     PROBIOTIC DAILY PO     vitamin E 400 UNIT capsule      TAKE these medications         amLODipine 5 MG tablet  Commonly known as:  NORVASC  Take 1 tablet (5 mg total) by mouth daily.     aspirin 81 MG tablet  Take 81 mg by mouth daily.     dicyclomine 10 MG capsule  Commonly known as:  BENTYL  Take 10 mg by mouth daily as needed for spasms.     diphenhydrAMINE 25 MG tablet  Commonly known as:  BENADRYL  Take 25 mg by mouth every 8 (eight) hours as needed for allergies.     estradiol 0.5 MG tablet  Commonly known as:  ESTRACE  Take 0.5 mg by mouth every Monday, Wednesday, and Friday.     nitroGLYCERIN 0.4 MG SL tablet  Commonly known as:  NITROSTAT  Place 1 tablet (0.4 mg total) under the tongue every 5 (five) minutes x 3 doses as needed for chest pain.         Duration of Discharge Encounter: Greater than 30 minutes including physician time.  Angelena Form PA-C 04/14/2014 10:51 AM

## 2014-04-14 NOTE — Progress Notes (Signed)
    Subjective:  No chest pain  Objective:  Vital Signs in the last 24 hours: Temp:  [98 F (36.7 C)-98.2 F (36.8 C)] 98.2 F (36.8 C) (01/26 0609) Pulse Rate:  [47-49] 47 (01/26 0609) Resp:  [18] 18 (01/26 0609) BP: (141-157)/(41-70) 145/70 mmHg (01/26 0609) SpO2:  [93 %-100 %] 100 % (01/26 0609) Weight:  [161 lb 2.5 oz (73.1 kg)] 161 lb 2.5 oz (73.1 kg) (01/26 0609)  Intake/Output from previous day:  Intake/Output Summary (Last 24 hours) at 04/14/14 0854 Last data filed at 04/14/14 0610  Gross per 24 hour  Intake    240 ml  Output   1475 ml  Net  -1235 ml    Physical Exam: General appearance: alert, cooperative and no distress Neck: no carotid bruit and no JVD Lungs: clear to auscultation bilaterally Heart: regular rate and rhythm   Rate: 45-55, PVCs, bigimeny  Rhythm: sinus bradycardia  Lab Results:  Recent Labs  04/12/14 0758 04/14/14 0415  WBC 9.6 9.3  HGB 12.2 11.2*  PLT 205 207    Recent Labs  04/13/14 0735 04/14/14 0415  NA 142 141  K 3.8 4.1  CL 103 104  CO2 30 28  GLUCOSE 96 113*  BUN 25* 28*  CREATININE 1.94* 1.87*    Recent Labs  04/12/14 0758 04/12/14 1215  TROPONINI 0.07* 0.06*   No results for input(s): INR in the last 72 hours.  Imaging: Imaging results have been reviewed She has not had a CXR  Cardiac Studies:  Assessment/Plan:   Principal Problem:   Chest pain with moderate risk of acute coronary syndrome Active Problems:   CAD S/P LAD DES '09. Myoview low risk 04/12/14   Troponin level elevated   HTN (hypertension)   Bradycardia   CKD (chronic kidney disease) stage 4, GFR 15-29 ml/min   Dyslipidemia   Statin intolerance   PLAN:  With her symptoms of chest pain to her back in setting of HTN, elevated Troponin,  there is concern for AO dissection. Unable to do CTA secondary to CRI. Will check PA and lateral CXR to r/o widened mediastinum. Consider adding Norvasc for HTN, stop NSAIDs. Possible home later today.    Kerin Ransom PA-C Beeper [phone] 04/14/2014, 8:54 AM   Agree with note written by Kerin Ransom PAC  No further CP/back pain. Will add low dose norvasc for BP. No NSAIDs. Check CXR. 2 week event monitor for brady. D/C home later today. ROV with Dr. Ron Parker.  Lorretta Harp 04/14/2014 9:04 AM

## 2014-04-16 ENCOUNTER — Other Ambulatory Visit: Payer: Self-pay

## 2014-04-16 ENCOUNTER — Encounter: Payer: Self-pay | Admitting: *Deleted

## 2014-04-16 MED ORDER — AMLODIPINE BESYLATE 5 MG PO TABS: 5.0000 mg | ORAL_TABLET | Freq: Every day | ORAL | Status: DC

## 2014-04-16 MED ORDER — NITROGLYCERIN 0.4 MG SL SUBL: 0.4000 mg | SUBLINGUAL_TABLET | SUBLINGUAL | Status: DC | PRN

## 2014-04-16 NOTE — Progress Notes (Signed)
Patient ID: Vanessa [name], female   DOB: Sep 02, 1927, 81 y.o.   MRN: [id number] Patient enrolled for [employer] to mail a 30 day cardiac event monitor to the home.

## 2014-04-20 ENCOUNTER — Telehealth: Payer: Self-pay | Admitting: Cardiology

## 2014-04-20 DIAGNOSIS — N184 Chronic kidney disease, stage 4 (severe): Secondary | ICD-10-CM | POA: Diagnosis not present

## 2014-04-20 NOTE — Telephone Encounter (Signed)
New message      Need an additional diagnosis for heart monitor

## 2014-04-20 NOTE — Telephone Encounter (Signed)
**Note De-Identified Meily Glowacki Obfuscation** Patty with [employer] has been given additional DX's of Presyncope and PVC's. She stated that was all she needed and thanked me for my assistance.

## 2014-04-22 ENCOUNTER — Encounter (INDEPENDENT_AMBULATORY_CARE_PROVIDER_SITE_OTHER): Payer: Medicare Other

## 2014-04-22 DIAGNOSIS — R001 Bradycardia, unspecified: Secondary | ICD-10-CM | POA: Diagnosis not present

## 2014-04-22 DIAGNOSIS — R55 Syncope and collapse: Secondary | ICD-10-CM | POA: Diagnosis not present

## 2014-04-22 DIAGNOSIS — R531 Weakness: Secondary | ICD-10-CM

## 2014-04-27 DIAGNOSIS — N179 Acute kidney failure, unspecified: Secondary | ICD-10-CM | POA: Diagnosis not present

## 2014-04-27 DIAGNOSIS — D631 Anemia in chronic kidney disease: Secondary | ICD-10-CM | POA: Diagnosis not present

## 2014-04-27 DIAGNOSIS — N189 Chronic kidney disease, unspecified: Secondary | ICD-10-CM | POA: Diagnosis not present

## 2014-04-27 DIAGNOSIS — N184 Chronic kidney disease, stage 4 (severe): Secondary | ICD-10-CM | POA: Diagnosis not present

## 2014-04-27 DIAGNOSIS — N2581 Secondary hyperparathyroidism of renal origin: Secondary | ICD-10-CM | POA: Diagnosis not present

## 2014-04-27 DIAGNOSIS — I129 Hypertensive chronic kidney disease with stage 1 through stage 4 chronic kidney disease, or unspecified chronic kidney disease: Secondary | ICD-10-CM | POA: Diagnosis not present

## 2014-04-28 ENCOUNTER — Telehealth (HOSPITAL_COMMUNITY): Payer: Self-pay | Admitting: *Deleted

## 2014-04-29 ENCOUNTER — Ambulatory Visit (HOSPITAL_COMMUNITY)
Admission: RE | Admit: 2014-04-29 | Discharge: 2014-04-29 | Disposition: A | Discharge status: Home / Self Care | Payer: Medicare Other | Source: Ambulatory Visit | Attending: Cardiology | Admitting: Cardiology

## 2014-04-29 DIAGNOSIS — I10 Essential (primary) hypertension: Secondary | ICD-10-CM

## 2014-04-29 DIAGNOSIS — N184 Chronic kidney disease, stage 4 (severe): Secondary | ICD-10-CM | POA: Diagnosis not present

## 2014-04-29 DIAGNOSIS — I129 Hypertensive chronic kidney disease with stage 1 through stage 4 chronic kidney disease, or unspecified chronic kidney disease: Secondary | ICD-10-CM | POA: Insufficient documentation

## 2014-04-29 NOTE — Progress Notes (Signed)
Renal Duplex Completed. Leandro Berkowitz, BS, RDMS, RVT  

## 2014-05-05 ENCOUNTER — Other Ambulatory Visit: Payer: Self-pay | Admitting: Nephrology

## 2014-05-05 DIAGNOSIS — N179 Acute kidney failure, unspecified: Secondary | ICD-10-CM

## 2014-05-11 ENCOUNTER — Telehealth: Payer: Self-pay | Admitting: Cardiology

## 2014-05-11 NOTE — Telephone Encounter (Signed)
Please call,having problems with her monitor.

## 2014-05-11 NOTE — Telephone Encounter (Signed)
Spoke with patient. She reports that she has been wearing a [employer] Monitor since about Feb 3-4. She states the plastic electrodes were causing her skin to become red/itching/blisters. She states her monitor/phone wasn't working right and was told by the monitor company yesterday to take the monitor off & they would mail her a new phone(?). She states she has not tried any sensitive skin electrodes.   She is wondering how much information has been obtained from the monitor thus far, in the event that it is enough info that she will not have to wear the monitor longer r/t skin issues.   Informed patient that the [facility] office does not have access to [employer] reports, but that her concerns will be forwarded on to the appropriate clinic staff to follow up with her on the monitor and if she does indeed need to wear it longer. Patient sees Dr. Ron Parker for cardiology care

## 2014-05-12 ENCOUNTER — Other Ambulatory Visit: Payer: Medicare Other

## 2014-05-12 ENCOUNTER — Inpatient Hospital Stay: Admission: RE | Admit: 2014-05-12 | Payer: Medicare Other | Source: Ambulatory Visit

## 2014-05-13 NOTE — Telephone Encounter (Addendum)
A 2 week event monitor was ordered at the pts d/c from [facility] on 1/26 for bradycardia and generalized weakness. The pt wore monitor from around 2/3 or 2/4 until 2/22 then the pt took off the monitor due to allergic reaction to electrodes.  Event monitor results, up to this point, was given to Dr Marlou Porch (Dr Ron Parker will not be back in office until next week) to determine if the pt needs to continue to wear monitor. Per Dr Marlou Porch it is ok for the pt to d/c the monitor.  I called the pt to let her know that she no longer has to wear the monitor and she stated that [employer] sent her another phone and electrodes but she has already mailed back to the company as she states she cant put any more electrodes on her skin due to painful rash from prior electrodes.  Will forward note to Dr Ron Parker as Juluis Rainier.

## 2014-05-14 ENCOUNTER — Encounter: Payer: Self-pay | Admitting: *Deleted

## 2014-05-14 NOTE — Progress Notes (Signed)
Patient ID: Vanessa [name], female   DOB: [birth year], 81 y.o.   MRN: [id number] [employer] 30 day cardiac event monitor mailed to the home and started 04/22/14.  Estimated EOS 05/21/14.

## 2014-06-04 ENCOUNTER — Encounter: Payer: Self-pay | Admitting: Cardiology

## 2014-06-04 DIAGNOSIS — R9430 Abnormal result of cardiovascular function study, unspecified: Secondary | ICD-10-CM | POA: Insufficient documentation

## 2014-06-05 ENCOUNTER — Ambulatory Visit (INDEPENDENT_AMBULATORY_CARE_PROVIDER_SITE_OTHER): Payer: Medicare Other | Admitting: Cardiology

## 2014-06-05 ENCOUNTER — Encounter: Payer: Self-pay | Admitting: Cardiology

## 2014-06-05 VITALS — BP 140/72 | HR 47 | Ht 64.0 in | Wt 159.8 lb

## 2014-06-05 DIAGNOSIS — N184 Chronic kidney disease, stage 4 (severe): Secondary | ICD-10-CM

## 2014-06-05 DIAGNOSIS — Z9861 Coronary angioplasty status: Secondary | ICD-10-CM

## 2014-06-05 DIAGNOSIS — R001 Bradycardia, unspecified: Secondary | ICD-10-CM | POA: Diagnosis not present

## 2014-06-05 DIAGNOSIS — I10 Essential (primary) hypertension: Secondary | ICD-10-CM | POA: Diagnosis not present

## 2014-06-05 DIAGNOSIS — I2510 Atherosclerotic heart disease of native coronary artery without angina pectoris: Secondary | ICD-10-CM

## 2014-06-05 NOTE — Assessment & Plan Note (Signed)
Patient has significant renal dysfunction. She is being assessed on an ongoing basis by the nephrology team.

## 2014-06-05 NOTE — Assessment & Plan Note (Addendum)
The patient has significant bradycardia. She does not have any marked pauses. She is not on any medications slowing her heart rate. She has had bradycardia in the past. However now she has exertional shortness of breath. There is no ischemia by nuclear scan and her left ventricular function is good by echo. When walking in the hallway her heart rate did not increase at all with walking. She appears to have chronotropic incompetence. I will be referring her to electrophysiology to get their input as to whether or not a pacemaker should be considered in this setting. I explained to the patient that I was sending her for further evaluation. It is not yet clear if a pacemaker would be appropriate.  As part of today's evaluation I spent greater than 25 minutes with her total care. More than half of this time was with direct contact describing all of the plans to the patient. We discussed her slow heart rate and her renal dysfunction at length.

## 2014-06-05 NOTE — Patient Instructions (Signed)
**Note De-Identified Elijah Michaelis Obfuscation** Your physician recommends that you continue on your current medications as directed. Please refer to the Current Medication list given to you today.  Dr Ron Parker is referring you to one of our EP cardiologist to consider question of pacemaker due to your sinus bradycardia and chronotropic incompetence.   Your physician recommends that you schedule a follow-up appointment in: 3 months

## 2014-06-05 NOTE — Assessment & Plan Note (Signed)
Coronary disease is stable. Nuclear stress study January, 2016 revealed no ischemia.

## 2014-06-05 NOTE — Assessment & Plan Note (Signed)
Blood pressure was significantly elevated when she was recently hospitalized. It is now come under better control.

## 2014-06-05 NOTE — Progress Notes (Signed)
Cardiology Office Note   Date:  06/05/2014   ID:  Vanessa [name], DOB 14-Sep-1927, MRN [id number]  PCP:  Marco Collie, MD  Cardiologist:  Dola Argyle, MD   Chief Complaint  Patient presents with  . Appointment    Follow-up bradycardia      History of Present Illness: Vanessa [name] is a 81 y.o. female who presents today for the follow-up of coronary disease, renal dysfunction, bradycardia. She was recently hospitalized with chest discomfort. During the hospitalization echo revealed good LV function. Nuclear scan revealed no ischemia. She had significant renal dysfunction. Cardiac catheterization was not done. She was seen by the nephrology team and many of her medications were changed. He was also noted that she had persistent sinus bradycardia. Her blood pressure was stable. She was discharged home with plans to wear an event recorder. She is now here for follow-up.  As part of today's evaluation I have reviewed her event recorder. I have also done an extensive review of her hospital records.  She's feeling relatively well. She does have some exertional shortness of breath.    Past Medical History  Diagnosis Date  . CAD (coronary artery disease) 03/2007    DES to LAD, January, 2009 / stress echo, July, 2009 elsewhere, no ischemia  . Multiple thyroid nodules     small heterogenious nodules...OK  . Retroperitoneal bleed     post cath 2004-surgery  . HTN (hypertension)   . Dyslipidemia   . Bradycardia 05/2010    March, 2012  . SOB (shortness of breath) 05/2010    March, 2012  . Statin intolerance   . Preop cardiovascular exam     Cardiac clearance for shoulder surgery October, 2013  . Hemorrhoids 1981  . Wrist injury     plate placed [id number] in rt wrist & removed 2008  . Shingles 07/19/2010  . Shoulder injury 2013    right   . Chronic kidney disease     stage IV  . Dyslipidemia   . Thyroid nodule   . Statin intolerance   . History of shingles   . Wrist injury   .  Shoulder injury   . Ejection fraction     Past Surgical History  Procedure Laterality Date  . Appendectomy  1949  . Tonsillectomy  1956  . Abdominal hysterectomy  1974  . Tibia fracture surgery  1995    rod placed   . Wrist fusion with iliac crest bone graft  2005  . Cataracts      lazer sx 2011 & 2012  . Eye surgery  2014    eye lid sx    Patient Active Problem List   Diagnosis Date Noted  . Ejection fraction   . Troponin level elevated 04/14/2014  . CKD (chronic kidney disease) stage 4, GFR 15-29 ml/min 04/13/2014  . Chest pain with moderate risk of acute coronary syndrome 04/12/2014  . Carotid artery disease without cerebral infarction 06/13/2013  . Left carotid bruit 06/04/2013  . Preop cardiovascular exam   . Statin intolerance   . Multiple thyroid nodules   . Retroperitoneal bleed   . HTN (hypertension)   . Dyslipidemia   . Bradycardia 05/19/2010  . SOB (shortness of breath) 05/19/2010  . CAD S/P LAD DES '09. Myoview low risk 04/12/14 03/21/2007      Current Outpatient Prescriptions  Medication Sig Dispense Refill  . amLODipine (NORVASC) 5 MG tablet Take 1 tablet (5 mg total) by mouth daily. 90 tablet 4  .  aspirin 81 MG tablet Take 81 mg by mouth daily.     Marland Kitchen dicyclomine (BENTYL) 10 MG capsule Take 10 mg by mouth daily as needed for spasms.     Marland Kitchen estradiol (ESTRACE) 0.5 MG tablet Take 0.5 mg by mouth every Monday, Wednesday, and Friday.    . nitroGLYCERIN (NITROSTAT) 0.4 MG SL tablet Place 1 tablet (0.4 mg total) under the tongue every 5 (five) minutes x 3 doses as needed for chest pain. 25 tablet 2   No current facility-administered medications for this visit.    Allergies:   Bystolic; Ciprofloxacin; Clindamycin; Cephalexin; Codeine; Hydrocodone-acetaminophen; Oxycodone hcl; Propoxyphene hcl; Propoxyphene n-acetaminophen; Statins; Tetanus toxoid; Tramadol; Levothyroxine sodium; Nitrofuran derivatives; Penicillins; and Tetracycline    Social History:  The  patient  reports that she has never smoked. She does not have any smokeless tobacco history on file. She reports that she does not drink alcohol or use illicit drugs.   Family History:  The patient's family history includes Cancer in an other family member; Coronary artery disease in an other family member; Heart attack in her mother; Pneumonia in her father.    ROS:  Please see the history of present illness.     Patient denies fever, chills, headache, sweats, rash, change in vision, change in hearing, chest pain, cough, nausea or vomiting, urinary symptoms. All other systems are reviewed and are negative.  PHYSICAL EXAM: VS:  BP 140/72 mmHg  Pulse 47  Ht 5\' 4"  (1.626 m)  Wt 159 lb 12.8 oz (72.485 kg)  BMI 27.42 kg/m2 , Patient is oriented to person time and place. Affect is normal. Head is atraumatic. Sclera and conjunctiva are normal. There is no jugular venous distention. Lungs are clear. Respiratory effort is nonlabored. Cardiac exam reveals an S1 and S2. The abdomen is soft. There is no peripheral edema. There are no musculoskeletal deformities. There are no skin rashes. The patient's resting pulse was 47-50. She was walked in the hallway and had no increase in her heart rate. Her heart rate remained 50 with walking.  EKG:   EKG is done today and reviewed by me. She has sinus bradycardia with a rate of 48. There are old diffuse T-wave changes. This tracing shows no significant change from the past. One PAC is seen.   Recent Labs: 04/12/2014: TSH 6.722* 04/14/2014: ALT 10; BUN 28*; Creatinine 1.87*; Hemoglobin 11.2*; Magnesium 2.3; Platelets 207; Potassium 4.1; Sodium 141    Lipid Panel    Component Value Date/Time   CHOL 191 04/12/2014 0120   TRIG 100 04/12/2014 0120   HDL 40 04/12/2014 0120   CHOLHDL 4.8 04/12/2014 0120   VLDL 20 04/12/2014 0120   LDLCALC 131* 04/12/2014 0120      Wt Readings from Last 3 Encounters:  06/05/14 159 lb 12.8 oz (72.485 kg)  04/14/14 161 lb 2.5  oz (73.1 kg)  06/04/13 166 lb (75.297 kg)      Current medicines are reviewed  The patient understands her medications.     ASSESSMENT AND PLAN:

## 2014-06-08 ENCOUNTER — Other Ambulatory Visit: Payer: Self-pay | Admitting: Cardiology

## 2014-06-08 NOTE — Telephone Encounter (Signed)
Rx has been sent to the pharmacy electronically. ° °

## 2014-06-09 DIAGNOSIS — D631 Anemia in chronic kidney disease: Secondary | ICD-10-CM | POA: Diagnosis not present

## 2014-06-09 DIAGNOSIS — N184 Chronic kidney disease, stage 4 (severe): Secondary | ICD-10-CM | POA: Diagnosis not present

## 2014-06-09 DIAGNOSIS — Z7689 Persons encountering health services in other specified circumstances: Secondary | ICD-10-CM | POA: Diagnosis not present

## 2014-06-09 DIAGNOSIS — N179 Acute kidney failure, unspecified: Secondary | ICD-10-CM | POA: Diagnosis not present

## 2014-06-09 DIAGNOSIS — I129 Hypertensive chronic kidney disease with stage 1 through stage 4 chronic kidney disease, or unspecified chronic kidney disease: Secondary | ICD-10-CM | POA: Diagnosis not present

## 2014-06-29 ENCOUNTER — Ambulatory Visit (INDEPENDENT_AMBULATORY_CARE_PROVIDER_SITE_OTHER): Payer: Medicare Other | Admitting: Internal Medicine

## 2014-06-29 ENCOUNTER — Encounter: Payer: Self-pay | Admitting: Internal Medicine

## 2014-06-29 VITALS — BP 130/62 | HR 47 | Ht 64.0 in | Wt 160.6 lb

## 2014-06-29 DIAGNOSIS — R001 Bradycardia, unspecified: Secondary | ICD-10-CM | POA: Diagnosis not present

## 2014-06-29 DIAGNOSIS — Z9861 Coronary angioplasty status: Secondary | ICD-10-CM

## 2014-06-29 DIAGNOSIS — E039 Hypothyroidism, unspecified: Secondary | ICD-10-CM

## 2014-06-29 DIAGNOSIS — I10 Essential (primary) hypertension: Secondary | ICD-10-CM

## 2014-06-29 DIAGNOSIS — I2510 Atherosclerotic heart disease of native coronary artery without angina pectoris: Secondary | ICD-10-CM

## 2014-06-29 NOTE — Patient Instructions (Signed)
Your physician recommends that you schedule a follow-up appointment as needed with Dr Rayann Heman  You have been referred to Dr Dagmar Hait for hypothyroidism

## 2014-06-29 NOTE — Progress Notes (Signed)
Electrophysiology Office Note   Date:  06/29/2014   ID:  Vanessa [name], DOB 1927-11-10, MRN [id number]  PCP:  Marco Collie, MD  Cardiologist:  Dr Ron Parker Primary Electrophysiologist: Thompson Grayer, MD    Chief Complaint  Patient presents with  . Bradycardia     History of Present Illness: Vanessa [name] is a 81 y.o. female who presents today for electrophysiology evaluation.   She presents for evaluation of chronic bradycardia.  She has exertional SOB and fatigue.  She has been documented to have resting sinus bradycardia.  She is on no AV nodal agents.  She was evaluated by Dr Ron Parker and did not have significant increase in heart rate with ambulation.  She recently had elevated TSH value.  She has not been further evaluated.  She apparently tried thyroid replacement in the past but did not tolerate this.   She has been reluctant to try additional thyroid supplementation.   Today, she denies symptoms of palpitations, chest pain, orthopnea, PND, lower extremity edema, claudication, dizziness, presyncope, syncope, bleeding, or neurologic sequela. The patient is tolerating medications without difficulties and is otherwise without complaint today.    Past Medical History  Diagnosis Date  . CAD (coronary artery disease) 03/2007    DES to LAD, January, 2009 / stress echo, July, 2009 elsewhere, no ischemia  . Multiple thyroid nodules     small heterogenious nodules...OK  . Retroperitoneal bleed     post cath 2004-surgery  . HTN (hypertension)   . Dyslipidemia   . Bradycardia 05/2010    March, 2012  . SOB (shortness of breath) 05/2010    March, 2012  . Statin intolerance   . Preop cardiovascular exam     Cardiac clearance for shoulder surgery October, 2013  . Hemorrhoids 1981  . Wrist injury     plate placed [id number] in rt wrist & removed 2008  . Shingles 07/19/2010  . Shoulder injury 2013    right   . Chronic kidney disease     stage IV  . Dyslipidemia   . Thyroid nodule   . Statin  intolerance   . History of shingles   . Wrist injury   . Shoulder injury    Past Surgical History  Procedure Laterality Date  . Appendectomy  1949  . Tonsillectomy  1956  . Abdominal hysterectomy  1974  . Tibia fracture surgery  1995    rod placed   . Wrist fusion with iliac crest bone graft  2005  . Cataracts      lazer sx 2011 & 2012  . Eye surgery  2014    eye lid sx     Current Outpatient Prescriptions  Medication Sig Dispense Refill  . amLODipine (NORVASC) 5 MG tablet Take 1 tablet (5 mg total) by mouth daily. 90 tablet 4  . aspirin 81 MG tablet Take 81 mg by mouth daily.     Marland Kitchen dicyclomine (BENTYL) 10 MG capsule Take 10 mg by mouth daily as needed for spasms.     Marland Kitchen estradiol (ESTRACE) 0.5 MG tablet Take 0.5 mg by mouth every Monday, Wednesday, and Friday.    . IRON PO Take 1 tablet by mouth daily. Pt unsure of strength (06/29/14)    . nitroGLYCERIN (NITROSTAT) 0.4 MG SL tablet Place 1 tablet (0.4 mg total) under the tongue every 5 (five) minutes x 3 doses as needed for chest pain. 25 tablet 2   No current facility-administered medications for this visit.    Allergies:  Bystolic; Ciprofloxacin; Clindamycin; Cephalexin; Codeine; Hydrocodone-acetaminophen; Oxycodone hcl; Propoxyphene hcl; Propoxyphene n-acetaminophen; Statins; Tetanus toxoid; Tramadol; Levothyroxine sodium; Nitrofuran derivatives; Penicillins; and Tetracycline   Social History:  The patient  reports that she has never smoked. She does not have any smokeless tobacco history on file. She reports that she does not drink alcohol or use illicit drugs.   Family History:  The patient's  family history includes Cancer in an other family member; Coronary artery disease in an other family member; Heart attack in her mother; Pneumonia in her father.    ROS:  Please see the history of present illness.   All other systems are reviewed and negative.    PHYSICAL EXAM: VS:  BP 130/62 mmHg  Pulse 47  Ht 5\' 4"  (1.626 m)   Wt 160 lb 9.6 oz (72.848 kg)  BMI 27.55 kg/m2 , BMI Body mass index is 27.55 kg/(m^2). GEN: Well nourished, well developed, in no acute distress HEENT: normal Neck: no JVD, carotid bruits, or masses Cardiac: bradycardic regular rhythm; no murmurs, rubs, or gallops,no edema  Respiratory:  clear to auscultation bilaterally, normal work of breathing GI: soft, nontender, nondistended, + BS MS: no deformity or atrophy Skin: warm and dry  Neuro:  Strength and sensation are intact Psych: euthymic mood, full affect  EKG:  EKG3/18/16 is reviewed and reveals sinus bradycardia 48 bpm, diffuse TWI   Recent Labs: 04/12/2014: TSH 6.722* 04/14/2014: ALT 10; BUN 28*; Creatinine 1.87*; Hemoglobin 11.2*; Magnesium 2.3; Platelets 207; Potassium 4.1; Sodium 141    Lipid Panel     Component Value Date/Time   CHOL 191 04/12/2014 0120   TRIG 100 04/12/2014 0120   HDL 40 04/12/2014 0120   CHOLHDL 4.8 04/12/2014 0120   VLDL 20 04/12/2014 0120   LDLCALC 131* 04/12/2014 0120     Wt Readings from Last 3 Encounters:  06/29/14 160 lb 9.6 oz (72.848 kg)  06/05/14 159 lb 12.8 oz (72.485 kg)  04/14/14 161 lb 2.5 oz (73.1 kg)      Other studies Reviewed: Additional studies/ records that were reviewed today include: event monitor  Review of the above records today demonstrates: sinus rhythm with heart rates mostly 50s-60s.  No prolonged pauses or profound bradycardic events are noted.   ASSESSMENT AND PLAN:  1. Symptomatic sinus bradycardia The patient clearly has sinus bradycardia.  She has symptoms of fatigue and decreased exercise tolerance.  Given elevated TSH, I think that we should further evaluate her thyroid function prior to pacemaker implantation.  She may have restoration in sinus node function with adequate thyroid replacement.  Unfortunately, therapy has been previously limited due to an intolerance of thyroid replacement therapy.  I will therefore refer to Endocrine for assistance with  thyroid replacement.  She will follow-up with Dr Ron Parker as scheduled.  Once adequate thyroid function is restored, if her sinus bradycardia has not improved then we could proceed with PPM at that time though she would like to avoid this if possible.  2. Hypothyroidism As above  3. CAD No ischemic symptoms  4. HTN Stable No change required today    Current medicines are reviewed at length with the patient today.   The patient does not have concerns regarding her medicines.  The following changes were made today:  none  Follow-up with Dr Ron Parker as scheduled.  I am happy to see if sinus nodal function does not improve after Endocrine evaluation.   Signed, Thompson Grayer, MD  06/29/2014 9:23 AM     [facility]  [address] [place] [address] [phone] (office) [phone] (fax)

## 2014-07-09 DIAGNOSIS — E039 Hypothyroidism, unspecified: Secondary | ICD-10-CM | POA: Diagnosis not present

## 2014-07-09 DIAGNOSIS — I2510 Atherosclerotic heart disease of native coronary artery without angina pectoris: Secondary | ICD-10-CM | POA: Diagnosis not present

## 2014-07-09 DIAGNOSIS — E042 Nontoxic multinodular goiter: Secondary | ICD-10-CM | POA: Diagnosis not present

## 2014-07-09 DIAGNOSIS — Z8619 Personal history of other infectious and parasitic diseases: Secondary | ICD-10-CM | POA: Diagnosis not present

## 2014-07-15 ENCOUNTER — Telehealth: Payer: Self-pay | Admitting: Internal Medicine

## 2014-07-15 DIAGNOSIS — E039 Hypothyroidism, unspecified: Secondary | ICD-10-CM

## 2014-07-15 NOTE — Telephone Encounter (Signed)
New message    Patient calling stating she will not be able to see Dr. Buddy Duty in 3 months .     Asking another referral to Dr. Dwyane Dee

## 2014-07-16 NOTE — Telephone Encounter (Signed)
Spoke with patient and referral has been made

## 2014-08-06 ENCOUNTER — Other Ambulatory Visit: Payer: Self-pay | Admitting: Endocrinology

## 2014-08-06 ENCOUNTER — Ambulatory Visit (INDEPENDENT_AMBULATORY_CARE_PROVIDER_SITE_OTHER): Payer: Medicare Other | Admitting: Endocrinology

## 2014-08-06 VITALS — BP 130/84 | HR 61 | Temp 97.9°F | Resp 14 | Ht 64.0 in | Wt 162.8 lb

## 2014-08-06 DIAGNOSIS — E038 Other specified hypothyroidism: Secondary | ICD-10-CM

## 2014-08-06 DIAGNOSIS — R5383 Other fatigue: Secondary | ICD-10-CM

## 2014-08-06 NOTE — Progress Notes (Signed)
Patient ID: Vanessa [name], female   DOB: [birth year], 81 y.o.   MRN: [id number]            Reason for Appointment:  Hypothyroidism, new visit    History of Present Illness:   The Hypothyroidism was first diagnosed in 2009 At that time she was being evaluated for her thyroid enlargement and was told that she had thyroid nodules No details about this evaluation are available and she did not have thyroid biopsy Subsequently about 7 years ago she was told that her blood test showed an underactive thyroid and appears that her TSH was 7.5 She thinks she was given thyroid supplements to try although not clear if she was having any symptoms of hypothyroidism She was tried on levothyroxine, Synthroid and Cytomel also but she apparently had side effects from all of these Reportedly she had symptoms of her throat closing up when she took Synthroid; with levothyroxine she had palpitations, skin itching, headaches and sweating and with liothyronine she was having itching  Patient was left untreated subsequently Apparently TSH was normal in 2012, not clear if she was taking any medication at that time  RECENT history: Since about October 2015 patient has been having marked fatigue all the time.  Previously was fairly active and exercising with water aerobics She does not think she has any significant cold intolerance, difficulty concentrating, dry skin, weight gain or any hair loss In 1/16 she was hospitalized and was found to be anemic and low in iron and was also having difficulties with renal insufficiency However even the treatment of her anemia and renal failure she does not feel any better and still feels fatigued   She has now been referred here by her cardiologist further management  Lab Results  Component Value Date   TSH 6.722* 04/12/2014   TSH 3.24 06/06/2010     Past Medical History  Diagnosis Date  . CAD (coronary artery disease) 03/2007    DES to LAD, January, 2009 / stress  echo, July, 2009 elsewhere, no ischemia  . Multiple thyroid nodules     small heterogenious nodules...OK  . Retroperitoneal bleed     post cath 2004-surgery  . HTN (hypertension)   . Dyslipidemia   . Bradycardia 05/2010    March, 2012  . SOB (shortness of breath) 05/2010    March, 2012  . Statin intolerance   . Preop cardiovascular exam     Cardiac clearance for shoulder surgery October, 2013  . Hemorrhoids 1981  . Wrist injury     plate placed [id number] in rt wrist & removed 2008  . Shingles 07/19/2010  . Shoulder injury 2013    right   . Chronic kidney disease     stage IV  . Dyslipidemia   . Thyroid nodule   . Statin intolerance   . History of shingles   . Wrist injury   . Shoulder injury     Past Surgical History  Procedure Laterality Date  . Appendectomy  1949  . Tonsillectomy  1956  . Abdominal hysterectomy  1974  . Tibia fracture surgery  1995    rod placed   . Wrist fusion with iliac crest bone graft  2005  . Cataracts      lazer sx 2011 & 2012  . Eye surgery  2014    eye lid sx    Family History  Problem Relation Age of Onset  . Cancer    . Coronary artery disease    .  Heart attack Mother   . Pneumonia Father     Social History:  reports that she has never smoked. She does not have any smokeless tobacco history on file. She reports that she does not drink alcohol or use illicit drugs.  Allergies:  Allergies  Allergen Reactions  . Bystolic USAA Hcl] Shortness Of Breath    SHORTNESS OF BREATH  . Ciprofloxacin Diarrhea  . Clindamycin Diarrhea  . Cephalexin Other (See Comments)    Red swollen face  . Codeine Nausea Only  . Hydrocodone-Acetaminophen Other (See Comments)    Hallucinations   . Oxycodone Hcl Other (See Comments)    Hallucinations  . Propoxyphene Hcl Itching  . Propoxyphene N-Acetaminophen Itching  . Statins Other (See Comments)    "Made arms feel like they weighted a ton"  . Tetanus Toxoid Other (See Comments)    Whelps   .  Tramadol Nausea And Vomiting  . Levothyroxine Sodium Itching, Palpitations and Other (See Comments)    Headache   . Nitrofuran Derivatives Diarrhea, Palpitations and Other (See Comments)     dizziness   . Penicillins Rash  . Tetracycline Rash and Other (See Comments)    whelps      Medication List       This list is accurate as of: 08/06/14  3:49 PM.  Always use your most recent med list.               amLODipine 5 MG tablet  Commonly known as:  NORVASC  Take 1 tablet (5 mg total) by mouth daily.     aspirin 81 MG tablet  Take 81 mg by mouth daily.     dicyclomine 10 MG capsule  Commonly known as:  BENTYL  Take 10 mg by mouth daily as needed for spasms.     estradiol 0.5 MG tablet  Commonly known as:  ESTRACE  Take 0.5 mg by mouth every Monday, Wednesday, and Friday.     IRON PO  Take 1 tablet by mouth daily. Pt unsure of strength (06/29/14)     nitroGLYCERIN 0.4 MG SL tablet  Commonly known as:  NITROSTAT  Place 1 tablet (0.4 mg total) under the tongue every 5 (five) minutes x 3 doses as needed for chest pain.        Review of Systems:  Trying to lose weight and she is very consistent with healthy diet  Wt Readings from Last 3 Encounters:  08/06/14 162 lb 12.8 oz (73.846 kg)  06/29/14 160 lb 9.6 oz (72.848 kg)  06/05/14 159 lb 12.8 oz (72.485 kg)    CARDIOLOGY:  she has a  history of high blood pressure treated with Norvasc .       She has been evaluated for bradycardia by cardiologist recently  She may get short of breath on exertion but is not doing much activity recently       GASTROENTEROLOGY:  no Change in bowel habits.      ENDOCRINOLOGY:  no history of Diabetes.     She had iron deficiency anemia and hemoglobin was slightly below 12.  No recent labs available from nephrologist who has stopped her iron about 2 months ago  She was followed by nephrologist for CKD, recent creatinine level not available  She takes HRT       CKD stopped vits 1/16  followed  Lab Results  Component Value Date   CREATININE 1.87* 04/14/2014   BUN 28* 04/14/2014   NA 141 04/14/2014  K 4.1 04/14/2014   CL 104 04/14/2014   CO2 28 04/14/2014   She has had problems with low back pain  PHYSICAL Examination:    BP 130/84 mmHg  Pulse 61  Temp(Src) 97.9 F (36.6 C)  Resp 14  Ht 5\' 4"  (1.626 m)  Wt 162 lb 12.8 oz (73.846 kg)  BMI 27.93 kg/m2  SpO2 96%  GENERAL:  Average build.  Well-built and nourished, pleasant, looks younger than stated age  No pallor, clubbing, lymphadenopathy or edema.    Skin:  no rash or pigmentation.  EYES:  No prominence of the eyes or swelling of the eyelids  ENT: Oral mucosa and tongue normal.  THYROID:  Not palpable.  HEART:  Normal  S1 and S2; no murmur or click.  CHEST:    Lungs: Vescicular breath sounds heard equally.  No crepitations/ wheeze.  ABDOMEN:  No distention.  Liver and spleen not palpable.  No other mass or tenderness.  NEUROLOGICAL: Reflexes are normal bilaterally at biceps.  JOINTS:  Normal hands and feet   Assessment/Plan   PRIMARY HYPOTHYROIDISM  Discussed with the patient that although she has had marked fatigue since late last year her thyroid levels have been only mildly abnormal this year Not clear if her symptoms are explained by the hypothyroidism Clearly she has not had much progression of her hypothyroidism since 2009 when her TSH was over 7 and most recent TSH in January was only 6.7 On exam does not have a goiter  Although she may be a good candidate for a therapeutic trial of thyroid supplementation to see if it will help her fatigue it would be difficult to do this because of her reported  intolerance to all types of thyroid medications which is unexplained However not clear what doses of thyroid supplements she had been tried on previously  Since she has not had recent thyroid levels done will need to check them today and decide on further treatment Will also check  free T4 level to rule out secondary hypothyroidism Most likely if she does need thyroid supplementation as a trial we would try her on brand name Synthroid and probably try 12.5 g every other day to start with and gradually increase the dose  Will discuss labs with the patient when available  [facility] 08/06/2014, 3:49 PM

## 2014-08-07 ENCOUNTER — Encounter: Payer: Self-pay | Admitting: Endocrinology

## 2014-08-07 LAB — TSH: TSH: 6.335 u[IU]/mL — AB (ref 0.350–4.500)

## 2014-08-07 LAB — T4, FREE: Free T4: 0.81 ng/dL (ref 0.80–1.80)

## 2014-08-08 NOTE — Progress Notes (Signed)
Quick Note:  Please let patient know that the thyroid level is slightly low as before. She can try generic levothyroxine again in a very low dose if she is agreeable to see if she feels better with this. She will have to take 25 g, half tablet daily for 2 weeks and then 1 tablet daily until her follow-up in about 6 weeks. If she does not want to take this will need to see her back in follow-up in 6 months  ______

## 2014-08-10 ENCOUNTER — Telehealth: Payer: Self-pay | Admitting: Endocrinology

## 2014-08-10 NOTE — Telephone Encounter (Signed)
Pt called wanting to know her lab results please advise pt

## 2014-08-11 ENCOUNTER — Other Ambulatory Visit: Payer: Self-pay | Admitting: *Deleted

## 2014-08-11 ENCOUNTER — Telehealth: Payer: Self-pay | Admitting: Endocrinology

## 2014-08-11 MED ORDER — LEVOTHYROXINE SODIUM 25 MCG PO TABS: ORAL_TABLET | ORAL | Status: DC

## 2014-08-11 NOTE — Telephone Encounter (Signed)
Results given to patient

## 2014-08-11 NOTE — Telephone Encounter (Signed)
Patient was returning Stephanie's phone call regarding her labs    Please advise    Thank you

## 2014-08-11 NOTE — Telephone Encounter (Signed)
LVM for pt to call back as soon as possible.   

## 2014-08-13 ENCOUNTER — Telehealth: Payer: Self-pay | Admitting: Endocrinology

## 2014-08-13 NOTE — Telephone Encounter (Signed)
Pt calling regarding the levothyroxine  Is working well so far

## 2014-08-13 NOTE — Telephone Encounter (Signed)
FYI  Please see below

## 2014-08-24 ENCOUNTER — Encounter: Payer: Self-pay | Admitting: Cardiology

## 2014-08-24 ENCOUNTER — Ambulatory Visit (INDEPENDENT_AMBULATORY_CARE_PROVIDER_SITE_OTHER): Payer: Medicare Other | Admitting: Cardiology

## 2014-08-24 VITALS — BP 130/62 | HR 51 | Ht 64.0 in | Wt 161.8 lb

## 2014-08-24 DIAGNOSIS — I2510 Atherosclerotic heart disease of native coronary artery without angina pectoris: Secondary | ICD-10-CM | POA: Diagnosis not present

## 2014-08-24 DIAGNOSIS — R001 Bradycardia, unspecified: Secondary | ICD-10-CM

## 2014-08-24 DIAGNOSIS — Z9861 Coronary angioplasty status: Secondary | ICD-10-CM | POA: Diagnosis not present

## 2014-08-24 DIAGNOSIS — I10 Essential (primary) hypertension: Secondary | ICD-10-CM

## 2014-08-24 NOTE — Assessment & Plan Note (Signed)
The patient continues to have bradycardia. Her thyroid is being treated. She is feeling better. When her thyroid has been completely normalized, her heart rate can be assessed further. If she has symptomatic bradycardia, she will be a candidate for pacing. At this point however she is doing very well.

## 2014-08-24 NOTE — Assessment & Plan Note (Signed)
Blood pressure stable. No change in therapy.

## 2014-08-24 NOTE — Assessment & Plan Note (Signed)
Coronary disease is stable. Her last nuclear study was January, 2016.

## 2014-08-24 NOTE — Progress Notes (Signed)
Cardiology Office Note   Date:  08/24/2014   ID:  Vanessa [name], DOB [birth year], MRN [id number]  PCP:  Marco Collie, MD  Cardiologist:  Dola Argyle, MD   Chief Complaint  Patient presents with  . Appointment    Follow-up coronary disease and bradycardia.      History of Present Illness: Vanessa [name] is a 81 y.o. female who presents today to follow-up coronary disease and bradycardia. I saw her last March, 2016. I referred her to Dr. Rayann Heman to help with the assessment of bradycardia. He noted that she needed further assessment of her thyroid. She was assessed very nicely by Dr. Dwyane Dee who put her on a very small dose of thyroid replacement. She is tolerating this and the dose will be increased over time. Her heart rate continues in the range of 45-50. She is feeling better. Originally she had exertional shortness of breath. I was concerned that this was from chronotropic incompetence. She is feeling better. The ultimate plan will be to follow her heart rate when her thyroid is completely normalized.  The patient is aware that I will be retiring at the end of September, 2016. She and I today decided that she will follow-up with Dr. Meda Coffee in 6 months.  Past Medical History  Diagnosis Date  . CAD (coronary artery disease) 03/2007    DES to LAD, January, 2009 / stress echo, July, 2009 elsewhere, no ischemia  . Multiple thyroid nodules     small heterogenious nodules...OK  . Retroperitoneal bleed     post cath 2004-surgery  . HTN (hypertension)   . Dyslipidemia   . Bradycardia 05/2010    March, 2012  . SOB (shortness of breath) 05/2010    March, 2012  . Statin intolerance   . Preop cardiovascular exam     Cardiac clearance for shoulder surgery October, 2013  . Hemorrhoids 1981  . Wrist injury     plate placed [id number] in rt wrist & removed 2008  . Shingles 07/19/2010  . Shoulder injury 2013    right   . Chronic kidney disease     stage IV  . Dyslipidemia   . Thyroid nodule     . Statin intolerance   . History of shingles   . Wrist injury   . Shoulder injury     Past Surgical History  Procedure Laterality Date  . Appendectomy  1949  . Tonsillectomy  1956  . Abdominal hysterectomy  1974  . Tibia fracture surgery  1995    rod placed   . Wrist fusion with iliac crest bone graft  2005  . Cataracts      lazer sx 2011 & 2012  . Eye surgery  2014    eye lid sx    Patient Active Problem List   Diagnosis Date Noted  . Hypothyroid 06/29/2014  . Ejection fraction   . Troponin level elevated 04/14/2014  . CKD (chronic kidney disease) stage 4, GFR 15-29 ml/min 04/13/2014  . Chest pain with moderate risk of acute coronary syndrome 04/12/2014  . Carotid artery disease without cerebral infarction 06/13/2013  . Left carotid bruit 06/04/2013  . Preop cardiovascular exam   . Statin intolerance   . Multiple thyroid nodules   . Retroperitoneal bleed   . HTN (hypertension)   . Dyslipidemia   . Bradycardia 05/19/2010  . SOB (shortness of breath) 05/19/2010  . CAD S/P LAD DES '09. Myoview low risk 04/12/14 03/21/2007      Current Outpatient  Prescriptions  Medication Sig Dispense Refill  . amLODipine (NORVASC) 5 MG tablet Take 1 tablet (5 mg total) by mouth daily. 90 tablet 4  . aspirin 81 MG tablet Take 81 mg by mouth daily.     Marland Kitchen dicyclomine (BENTYL) 10 MG capsule Take 10 mg by mouth daily as needed for spasms.     Marland Kitchen estradiol (ESTRACE) 0.5 MG tablet Take 0.5 mg by mouth every Monday, Wednesday, and Friday.    . IRON PO Take 1 tablet by mouth daily. Pt unsure of strength (06/29/14)    . levothyroxine (SYNTHROID, LEVOTHROID) 25 MCG tablet Take 12.5 mcg by mouth daily.     . nitroGLYCERIN (NITROSTAT) 0.4 MG SL tablet Place 1 tablet (0.4 mg total) under the tongue every 5 (five) minutes x 3 doses as needed for chest pain. 25 tablet 2   No current facility-administered medications for this visit.    Allergies:   Bystolic; Ciprofloxacin; Clindamycin; Cephalexin;  Codeine; Hydrocodone-acetaminophen; Oxycodone hcl; Propoxyphene hcl; Propoxyphene n-acetaminophen; Statins; Tetanus toxoid; Tramadol; Levothyroxine sodium; Nitrofuran derivatives; Penicillins; and Tetracycline    Social History:  The patient  reports that she has never smoked. She does not have any smokeless tobacco history on file. She reports that she does not drink alcohol or use illicit drugs.   Family History:  The patient's family history includes Cancer in an other family member; Coronary artery disease in an other family member; Heart attack in her mother; Pneumonia in her father.    ROS:  Please see the history of present illness.    Patient denies fever, chills, headache, sweats, rash, change in vision, change in hearing, chest pain, cough, nausea or vomiting, urinary symptoms. All other systems are reviewed and are negative.    PHYSICAL EXAM: VS:  BP 130/62 mmHg  Pulse 51  Ht 5\' 4"  (1.626 m)  Wt 161 lb 12.8 oz (73.392 kg)  BMI 27.76 kg/m2 , Patient is oriented to person time and place. Affect is normal. Head is atraumatic. Sclera and conjunctiva are normal. There is no jugular venous distention. Lungs are clear. Respiratory effort is nonlabored. Cardiac exam reveals S1 and S2. The abdomen is soft. There is no peripheral edema. There are no musculoskeletal deformities. There are no skin rashes.  EKG:   EKG is not done today.   Recent Labs: 04/12/2014: TSH 6.722* 04/14/2014: ALT 10; BUN 28*; Creatinine 1.87*; Hemoglobin 11.2*; Magnesium 2.3; Platelets 207; Potassium 4.1; Sodium 141    Lipid Panel    Component Value Date/Time   CHOL 191 04/12/2014 0120   TRIG 100 04/12/2014 0120   HDL 40 04/12/2014 0120   CHOLHDL 4.8 04/12/2014 0120   VLDL 20 04/12/2014 0120   LDLCALC 131* 04/12/2014 0120      Wt Readings from Last 3 Encounters:  08/24/14 161 lb 12.8 oz (73.392 kg)  08/06/14 162 lb 12.8 oz (73.846 kg)  06/29/14 160 lb 9.6 oz (72.848 kg)      Current medicines are  reviewed  The patient understands her medications.     ASSESSMENT AND PLAN:

## 2014-08-24 NOTE — Patient Instructions (Signed)
**Note De-Identified Vanessa [name] Obfuscation** Medication Instructions:  Same-no change  Labwork: none  Testing/Procedures: none  Follow-Up: Your physician wants you to follow-up in: 6 months with Dr Meda Coffee. You will receive a reminder letter in the mail two months in advance. If you don't receive a letter, please call our office to schedule the follow-up appointment.

## 2014-09-10 ENCOUNTER — Telehealth: Payer: Self-pay | Admitting: Endocrinology

## 2014-09-10 ENCOUNTER — Other Ambulatory Visit: Payer: Self-pay | Admitting: *Deleted

## 2014-09-10 MED ORDER — LEVOTHYROXINE SODIUM 25 MCG PO TABS: 12.5000 ug | ORAL_TABLET | Freq: Every day | ORAL | Status: DC

## 2014-09-10 NOTE — Telephone Encounter (Signed)
Pt need refill on  levothyroxine to [facility]  X  [phone]

## 2014-09-10 NOTE — Telephone Encounter (Signed)
rx sent

## 2014-09-22 ENCOUNTER — Telehealth: Payer: Self-pay | Admitting: Endocrinology

## 2014-09-22 ENCOUNTER — Other Ambulatory Visit: Payer: Self-pay | Admitting: *Deleted

## 2014-09-22 MED ORDER — LEVOTHYROXINE SODIUM 25 MCG PO TABS: 25.0000 ug | ORAL_TABLET | Freq: Every day | ORAL | Status: DC

## 2014-09-22 NOTE — Telephone Encounter (Signed)
Pt needs updated rx for levothyroxine 1 full tab daily called into [facility] 90 day supply

## 2014-09-22 NOTE — Telephone Encounter (Signed)
rx sent

## 2014-10-06 DIAGNOSIS — N184 Chronic kidney disease, stage 4 (severe): Secondary | ICD-10-CM | POA: Diagnosis not present

## 2014-10-06 DIAGNOSIS — D631 Anemia in chronic kidney disease: Secondary | ICD-10-CM | POA: Diagnosis not present

## 2014-10-06 DIAGNOSIS — N189 Chronic kidney disease, unspecified: Secondary | ICD-10-CM | POA: Diagnosis not present

## 2014-10-06 DIAGNOSIS — N179 Acute kidney failure, unspecified: Secondary | ICD-10-CM | POA: Diagnosis not present

## 2014-10-06 DIAGNOSIS — N2581 Secondary hyperparathyroidism of renal origin: Secondary | ICD-10-CM | POA: Diagnosis not present

## 2014-10-06 DIAGNOSIS — I129 Hypertensive chronic kidney disease with stage 1 through stage 4 chronic kidney disease, or unspecified chronic kidney disease: Secondary | ICD-10-CM | POA: Diagnosis not present

## 2014-11-02 ENCOUNTER — Telehealth: Payer: Self-pay | Admitting: Endocrinology

## 2014-11-02 NOTE — Telephone Encounter (Signed)
lmtcb

## 2014-11-02 NOTE — Telephone Encounter (Signed)
Patient think she is having allergic reaction to medication levothyroxine. Please advise

## 2014-11-09 ENCOUNTER — Telehealth: Payer: Self-pay | Admitting: Endocrinology

## 2014-11-09 NOTE — Telephone Encounter (Signed)
Have discussed with patient.  She had taken half tablet daily for 10 days and then 1 tablet daily  She had headache and palpitations on Friday. Discussed with her that she can continue to leave off the medication but come in at the first available appointment for office visit and have labs done the same day

## 2014-11-09 NOTE — Telephone Encounter (Signed)
Pt has had an allergic reaction to Levothyroxine and she stated that the front office staff was to communicate the urgency of her call and that did not occur.The pt has been off this medication for three days and would like to know what she should do.Please advise

## 2014-11-09 NOTE — Telephone Encounter (Signed)
Pt calling Rhonda back, no note saying why

## 2014-11-11 ENCOUNTER — Ambulatory Visit: Payer: Medicare Other | Admitting: Endocrinology

## 2014-11-11 ENCOUNTER — Other Ambulatory Visit: Payer: Medicare Other

## 2014-11-12 ENCOUNTER — Other Ambulatory Visit: Payer: Self-pay | Admitting: *Deleted

## 2014-11-12 ENCOUNTER — Encounter: Payer: Self-pay | Admitting: Endocrinology

## 2014-11-12 ENCOUNTER — Ambulatory Visit (INDEPENDENT_AMBULATORY_CARE_PROVIDER_SITE_OTHER): Payer: Medicare Other | Admitting: Endocrinology

## 2014-11-12 ENCOUNTER — Other Ambulatory Visit: Payer: Medicare Other

## 2014-11-12 VITALS — BP 138/78 | HR 74 | Temp 98.3°F | Resp 16 | Ht 64.0 in | Wt 155.0 lb

## 2014-11-12 DIAGNOSIS — E038 Other specified hypothyroidism: Secondary | ICD-10-CM

## 2014-11-12 DIAGNOSIS — E042 Nontoxic multinodular goiter: Secondary | ICD-10-CM

## 2014-11-12 LAB — T4, FREE: Free T4: 0.68 ng/dL (ref 0.60–1.60)

## 2014-11-12 LAB — TSH: TSH: 1.31 u[IU]/mL (ref 0.35–4.50)

## 2014-11-12 NOTE — Progress Notes (Signed)
Quick Note:  Please let patient know that the thyroid test is now normal she will need to call if she starts having fatigue and may potentially try only half tablet daily  ______

## 2014-11-12 NOTE — Progress Notes (Signed)
Patient ID: Vanessa [name], female   DOB: 1927-10-11, 81 y.o.   MRN: [id number]            Reason for Appointment:  Hypothyroidism, new visit    History of Present Illness:   The Hypothyroidism was first diagnosed in 2009 At that time she was being evaluated for her thyroid enlargement and was told that she had thyroid nodules No details about this evaluation are available and she did not have thyroid biopsy Subsequently about 7 years ago she was told that her blood test showed an underactive thyroid and appears that her TSH was 7.5 She thinks she was given thyroid supplements to try although not clear if she was having any symptoms of hypothyroidism She was tried on levothyroxine, Synthroid and Cytomel also but she apparently had side effects from all of these Reportedly she had symptoms of her throat closing up when she took Synthroid; with levothyroxine she had palpitations, skin itching, headaches and sweating and with liothyronine she was having itching  Patient was left untreated subsequently Apparently TSH was normal in 2012, not clear if she was taking any medication at that time  RECENT history: Since about October 2015 patient has been having marked fatigue without any significant cold intolerance, difficulty concentrating, dry skin, weight gain or any hair loss Previously was fairly active and exercising with water aerobics  Because of her relatively high TSH level she was seen in the office in 5/16 She was empirically started on levothyroxine 25 g, half tablet daily which she took for 10 days and then went on to 25 g daily Patient is not a very good historian but she does not think she felt any better with taking the levothyroxine Last Friday night she started having palpitations and she checked her blood pressure and pulse, her pulse was about 110 and came down to normal only the next morning Since then she has not taken any thyroid hormone pills  However she says that  this week her energy level has improved significantly and she does not feel tired   Lab Results  Component Value Date   TSH 1.31 11/12/2014   TSH 6.722* 04/12/2014   TSH 3.24 06/06/2010   FREET4 0.68 11/12/2014      Past Medical History  Diagnosis Date  . CAD (coronary artery disease) 03/2007    DES to LAD, January, 2009 / stress echo, July, 2009 elsewhere, no ischemia  . Multiple thyroid nodules     small heterogenious nodules...OK  . Retroperitoneal bleed     post cath 2004-surgery  . HTN (hypertension)   . Dyslipidemia   . Bradycardia 05/2010    March, 2012  . SOB (shortness of breath) 05/2010    March, 2012  . Statin intolerance   . Preop cardiovascular exam     Cardiac clearance for shoulder surgery October, 2013  . Hemorrhoids 1981  . Wrist injury     plate placed [id number] in rt wrist & removed 2008  . Shingles 07/19/2010  . Shoulder injury 2013    right   . Chronic kidney disease     stage IV  . Dyslipidemia   . Thyroid nodule   . Statin intolerance   . History of shingles   . Wrist injury   . Shoulder injury     Past Surgical History  Procedure Laterality Date  . Appendectomy  1949  . Tonsillectomy  1956  . Abdominal hysterectomy  1974  . Tibia fracture surgery  1995  rod placed   . Wrist fusion with iliac crest bone graft  2005  . Cataracts      lazer sx 2011 & 2012  . Eye surgery  2014    eye lid sx    Family History  Problem Relation Age of Onset  . Cancer    . Coronary artery disease    . Heart attack Mother   . Pneumonia Father     Social History:  reports that she has never smoked. She does not have any smokeless tobacco history on file. She reports that she does not drink alcohol or use illicit drugs.  Allergies:  Allergies  Allergen Reactions  . Bystolic USAA Hcl] Shortness Of Breath    SHORTNESS OF BREATH  . Ciprofloxacin Diarrhea  . Clindamycin Diarrhea  . Cephalexin Other (See Comments)    Red swollen face  . Codeine  Nausea Only  . Hydrocodone-Acetaminophen Other (See Comments)    Hallucinations   . Oxycodone Hcl Other (See Comments)    Hallucinations  . Propoxyphene Hcl Itching  . Propoxyphene N-Acetaminophen Itching  . Statins Other (See Comments)    "Made arms feel like they weighted a ton"  . Tetanus Toxoid Other (See Comments)    Whelps   . Tramadol Nausea And Vomiting  . Levothyroxine Sodium Itching, Palpitations and Other (See Comments)    Headache ----PT STATED SHE HAS STARTED THIS MEDICATION RECENTLY WITH NO SIDE EFFECTS  . Nitrofuran Derivatives Diarrhea, Palpitations and Other (See Comments)     dizziness   . Penicillins Rash  . Tetracycline Rash and Other (See Comments)    whelps      Medication List       This list is accurate as of: 11/12/14 11:03 AM.  Always use your most recent med list.               amLODipine 5 MG tablet  Commonly known as:  NORVASC  Take 1 tablet (5 mg total) by mouth daily.     aspirin 81 MG tablet  Take 81 mg by mouth daily.     dicyclomine 10 MG capsule  Commonly known as:  BENTYL  Take 10 mg by mouth daily as needed for spasms.     estradiol 0.5 MG tablet  Commonly known as:  ESTRACE  Take 0.5 mg by mouth every Monday, Wednesday, and Friday.     IRON PO  Take 1 tablet by mouth daily. Pt unsure of strength (06/29/14)     levothyroxine 25 MCG tablet  Commonly known as:  SYNTHROID, LEVOTHROID  Take 1 tablet (25 mcg total) by mouth daily.     nitroGLYCERIN 0.4 MG SL tablet  Commonly known as:  NITROSTAT  Place 1 tablet (0.4 mg total) under the tongue every 5 (five) minutes x 3 doses as needed for chest pain.        Review of Systems:  She is very consistent with healthy diet and appears to have lost some weight  Wt Readings from Last 3 Encounters:  11/12/14 155 lb (70.308 kg)  08/24/14 161 lb 12.8 oz (73.392 kg)  08/06/14 162 lb 12.8 oz (73.846 kg)    CARDIOLOGY:  she has a  history of high blood pressure treated with  Norvasc .       She has been evaluated for bradycardia by cardiologist in June   PHYSICAL Examination:    BP 138/78 mmHg  Pulse 74  Temp(Src) 98.3 F (36.8 C)  Resp 16  Ht 5\' 4"  (1.626 m)  Wt 155 lb (70.308 kg)  BMI 26.59 kg/m2  SpO2 95%  Thyroid not palpable. No tremor Biceps reflexes are normal  Assessment/Plan   PRIMARY HYPOTHYROIDISM, mild  According to the patient she did not feel subjectively better with starting levothyroxine and taking the 25 g dose for nearly 3 months She did have an episode of tachycardia last week which is unusual for her  Patient states that she is having much less fatigue this week and does not know why Most likely this is unrelated to her hypothyroidism  For now will check her thyroid levels today; she probably still has some residual effects of taking her levothyroxine even though she has not taken it for about 5 days  She will also follow-up in 3 months as scheduled  Mahi Zabriskie 11/12/2014, 11:03 AM    Addendum: TSH 1.3.  Will not restart her levothyroxine as yet, she will call if she starts having fatigue and may potentially try only half tablet daily

## 2014-12-22 DIAGNOSIS — Z1231 Encounter for screening mammogram for malignant neoplasm of breast: Secondary | ICD-10-CM | POA: Diagnosis not present

## 2015-01-19 DIAGNOSIS — I129 Hypertensive chronic kidney disease with stage 1 through stage 4 chronic kidney disease, or unspecified chronic kidney disease: Secondary | ICD-10-CM | POA: Diagnosis not present

## 2015-01-19 DIAGNOSIS — D631 Anemia in chronic kidney disease: Secondary | ICD-10-CM | POA: Diagnosis not present

## 2015-01-19 DIAGNOSIS — N184 Chronic kidney disease, stage 4 (severe): Secondary | ICD-10-CM | POA: Diagnosis not present

## 2015-01-19 DIAGNOSIS — N179 Acute kidney failure, unspecified: Secondary | ICD-10-CM | POA: Diagnosis not present

## 2015-01-19 DIAGNOSIS — N2581 Secondary hyperparathyroidism of renal origin: Secondary | ICD-10-CM | POA: Diagnosis not present

## 2015-02-02 DIAGNOSIS — N184 Chronic kidney disease, stage 4 (severe): Secondary | ICD-10-CM | POA: Diagnosis not present

## 2015-02-08 ENCOUNTER — Encounter: Payer: Self-pay | Admitting: Endocrinology

## 2015-02-08 ENCOUNTER — Ambulatory Visit (INDEPENDENT_AMBULATORY_CARE_PROVIDER_SITE_OTHER): Payer: Medicare Other | Admitting: Endocrinology

## 2015-02-08 VITALS — BP 128/78 | HR 41 | Temp 98.2°F | Resp 14 | Ht 64.0 in | Wt 162.4 lb

## 2015-02-08 DIAGNOSIS — E038 Other specified hypothyroidism: Secondary | ICD-10-CM | POA: Diagnosis not present

## 2015-02-08 LAB — TSH: TSH: 5.48 u[IU]/mL — ABNORMAL HIGH (ref 0.35–4.50)

## 2015-02-08 LAB — T4, FREE: Free T4: 0.58 ng/dL — ABNORMAL LOW (ref 0.60–1.60)

## 2015-02-08 NOTE — Progress Notes (Signed)
Quick Note:  Please let patient know that the thyroid level is borderline and will not need any Synthroid. Will have her come back in 6 months as scheduled ______

## 2015-02-08 NOTE — Progress Notes (Signed)
Patient ID: Vanessa [name], female   DOB: 1927-11-10, 81 y.o.   MRN: [id number]            Reason for Appointment:  Hypothyroidism, new visit    History of Present Illness:   The Hypothyroidism was first diagnosed in 2009 At that time she was being evaluated for her thyroid enlargement and was told that she had thyroid nodules No details about this evaluation are available and she did not have thyroid biopsy Subsequently about 7 years ago she was told that her blood test showed an underactive thyroid and appears that her TSH was 7.5 She thinks she was given thyroid supplements to try although not clear if she was having any symptoms of hypothyroidism She was tried on levothyroxine, Synthroid and Cytomel also but she apparently had side effects from all of these Reportedly she had symptoms of her throat closing up when she took Synthroid; with levothyroxine she had palpitations, skin itching, headaches and sweating and with liothyronine she was having itching  Patient was left untreated subsequently Apparently TSH was normal in 2012, not clear if she was taking any medication at that time Since about October 2015 patient had been having marked fatigue without any significant cold intolerance, difficulty concentrating, dry skin, weight gain or any hair loss Previously was fairly active and exercising with water aerobics  RECENT history:  Because of her relatively high TSH levels of about 6-7 she was seen in the office in 5/16 She was empirically started on levothyroxine 25 g, half tablet daily because of her symptoms of fatigue She took a half tablet for 10 days and then went on to 25 g daily However because of an episode of palpitations the medication was stopped  Her TSH level about 5 days after stopping the supplement was normal at 1.3 Patient is not a very good historian but she does not think she felt any better with taking the levothyroxine  Now she says that her energy level  has improved significantly and she does not feel tired   Lab Results  Component Value Date   TSH 1.31 11/12/2014   TSH 6.335* 08/06/2014   TSH 6.722* 04/12/2014   FREET4 0.68 11/12/2014   FREET4 0.81 08/06/2014      Past Medical History  Diagnosis Date  . CAD (coronary artery disease) 03/2007    DES to LAD, January, 2009 / stress echo, July, 2009 elsewhere, no ischemia  . Multiple thyroid nodules     small heterogenious nodules...OK  . Retroperitoneal bleed     post cath 2004-surgery  . HTN (hypertension)   . Dyslipidemia   . Bradycardia 05/2010    March, 2012  . SOB (shortness of breath) 05/2010    March, 2012  . Statin intolerance   . Preop cardiovascular exam     Cardiac clearance for shoulder surgery October, 2013  . Hemorrhoids 1981  . Wrist injury     plate placed [id number] in rt wrist & removed 2008  . Shingles 07/19/2010  . Shoulder injury 2013    right   . Chronic kidney disease     stage IV  . Dyslipidemia   . Thyroid nodule   . Statin intolerance   . History of shingles   . Wrist injury   . Shoulder injury     Past Surgical History  Procedure Laterality Date  . Appendectomy  1949  . Tonsillectomy  1956  . Abdominal hysterectomy  1974  . Tibia fracture surgery  1995    rod placed   . Wrist fusion with iliac crest bone graft  2005  . Cataracts      lazer sx 2011 & 2012  . Eye surgery  2014    eye lid sx    Family History  Problem Relation Age of Onset  . Cancer    . Coronary artery disease    . Heart attack Mother   . Pneumonia Father     Social History:  reports that she has never smoked. She does not have any smokeless tobacco history on file. She reports that she does not drink alcohol or use illicit drugs.  Allergies:  Allergies  Allergen Reactions  . Bystolic USAA Hcl] Shortness Of Breath    SHORTNESS OF BREATH  . Ciprofloxacin Diarrhea  . Clindamycin Diarrhea  . Cephalexin Other (See Comments)    Red swollen face  . Codeine  Nausea Only  . Hydrocodone-Acetaminophen Other (See Comments)    Hallucinations   . Oxycodone Hcl Other (See Comments)    Hallucinations  . Propoxyphene Hcl Itching  . Propoxyphene N-Acetaminophen Itching  . Statins Other (See Comments)    "Made arms feel like they weighted a ton"  . Tetanus Toxoid Other (See Comments)    Whelps   . Tramadol Nausea And Vomiting  . Levothyroxine Sodium Itching, Palpitations and Other (See Comments)    Headache ----PT STATED SHE HAS STARTED THIS MEDICATION RECENTLY WITH NO SIDE EFFECTS  . Nitrofuran Derivatives Diarrhea, Palpitations and Other (See Comments)     dizziness   . Penicillins Rash  . Tetracycline Rash and Other (See Comments)    whelps      Medication List       This list is accurate as of: 02/08/15  9:14 AM.  Always use your most recent med list.               amLODipine 5 MG tablet  Commonly known as:  NORVASC  Take 1 tablet (5 mg total) by mouth daily.     aspirin 81 MG tablet  Take 81 mg by mouth daily.     dicyclomine 10 MG capsule  Commonly known as:  BENTYL  Take 10 mg by mouth daily as needed for spasms.     estradiol 0.5 MG tablet  Commonly known as:  ESTRACE  Take 0.5 mg by mouth every Monday, Wednesday, and Friday.     levothyroxine 25 MCG tablet  Commonly known as:  SYNTHROID, LEVOTHROID  Take 1 tablet (25 mcg total) by mouth daily.     nitroGLYCERIN 0.4 MG SL tablet  Commonly known as:  NITROSTAT  Place 1 tablet (0.4 mg total) under the tongue every 5 (five) minutes x 3 doses as needed for chest pain.        Review of Systems:  She is very consistent with healthy diet ,   Wt Readings from Last 3 Encounters:  02/08/15 162 lb 6.4 oz (73.664 kg)  11/12/14 155 lb (70.308 kg)  08/24/14 161 lb 12.8 oz (73.392 kg)    CARDIOLOGY:  she has a  history of high blood pressure treated with Norvasc .        She has been evaluated for sinus bradycardia by cardiologist in June; she thinks her pulse goes up  to about 60 when she is exercising in water aerobics   PHYSICAL Examination:    BP 128/78 mmHg  Pulse 41  Temp(Src) 98.2 F (36.8 C)  Resp 14  Ht 5\' 4"  (1.626 m)  Wt 162 lb 6.4 oz (73.664 kg)  BMI 27.86 kg/m2  SpO2 94%  Thyroid not palpable. No tremor Biceps reflexes are normal Heart rate 44 regular  Assessment/Plan   PRIMARY HYPOTHYROIDISM, mild  Difficulty noted the patient is symptomatic. Previously he had symptoms of fatigue and does not appear to have symptomatically improved with levothyroxine supplement Also she has not taken any because of an episode of palpitations which she thinks was related to taking the medication  Clinically she is doing well without any fatigue now and on no supplementation Will check her TSH again and decide on further treatment and follow-up Most likely unless her TSH is at least over 10 will not need supplementation  Vanessa [name] 02/08/2015, 9:14 AM    Addendum: TSH 5.5, can continue to observe

## 2015-02-24 ENCOUNTER — Encounter: Payer: Self-pay | Admitting: Cardiology

## 2015-02-24 ENCOUNTER — Ambulatory Visit (INDEPENDENT_AMBULATORY_CARE_PROVIDER_SITE_OTHER): Payer: Medicare Other | Admitting: Cardiology

## 2015-02-24 VITALS — BP 130/70 | HR 55 | Ht 64.0 in | Wt 161.0 lb

## 2015-02-24 DIAGNOSIS — R001 Bradycardia, unspecified: Secondary | ICD-10-CM | POA: Diagnosis not present

## 2015-02-24 DIAGNOSIS — I2510 Atherosclerotic heart disease of native coronary artery without angina pectoris: Secondary | ICD-10-CM

## 2015-02-24 DIAGNOSIS — I2583 Coronary atherosclerosis due to lipid rich plaque: Secondary | ICD-10-CM

## 2015-02-24 DIAGNOSIS — I10 Essential (primary) hypertension: Secondary | ICD-10-CM

## 2015-02-24 NOTE — Patient Instructions (Signed)
Your physician recommends that you continue on your current medications as directed. Please refer to the Current Medication list given to you today.     Your physician wants you to follow-up in: 6 MONTHS WITH DR NELSON You will receive a reminder letter in the mail two months in advance. If you don't receive a letter, please call our office to schedule the follow-up appointment.  

## 2015-02-24 NOTE — Progress Notes (Signed)
Patient ID: Vanessa [name], female   DOB: 06-Jan-1928, 81 y.o.   MRN: [id number]      Cardiology Office Note   Date:  02/24/2015   ID:  Vanessa [name], DOB 1927-06-02, MRN [id number]  PCP:  Hoyt Koch, MD  Cardiologist:  Dorothy Spark, MD   Follow up for bradycardia and coronary artery disease.  History of Present Illness: Vanessa [name] is a 81 y.o. female who presents today to follow-up coronary disease and bradycardia. I saw her last March, 2016. I referred her to Dr. Rayann Heman to help with the assessment of bradycardia. He noted that she needed further assessment of her thyroid. She was assessed very nicely by Dr. Dwyane Dee who put her on a very small dose of thyroid replacement. She is tolerating this and the dose will be increased over time. Her heart rate continues in the range of 45-50. She is feeling better. Originally she had exertional shortness of breath. I was concerned that this was from chronotropic incompetence. She is feeling better. The ultimate plan will be to follow her heart rate when her thyroid is completely normalized.  02/24/2011 - this is a 6 months follow-up for coronary artery disease in sinus bradycardia. Patient denies any symptoms of chest pain, shortness of breath, lower extremity edema orthopnea or proximal nocturnal dyspnea. She was seen by Dr. Rayann Heman who stated that at this time she doesn't qualify for pacemaker. She states she doesn't feel any dizziness and denies any presyncope or syncope.She didn't need to use any sublingual nitroglycerin.   Past Medical History  Diagnosis Date  . CAD (coronary artery disease) 03/2007    DES to LAD, January, 2009 / stress echo, July, 2009 elsewhere, no ischemia  . Multiple thyroid nodules     small heterogenious nodules...OK  . Retroperitoneal bleed     post cath 2004-surgery  . HTN (hypertension)   . Dyslipidemia   . Bradycardia 05/2010    March, 2012  . SOB (shortness of breath) 05/2010    March, 2012  .  Statin intolerance   . Preop cardiovascular exam     Cardiac clearance for shoulder surgery October, 2013  . Hemorrhoids 1981  . Wrist injury     plate placed [id number] in rt wrist & removed 2008  . Shingles 07/19/2010  . Shoulder injury 2013    right   . Chronic kidney disease     stage IV  . Dyslipidemia   . Thyroid nodule   . Statin intolerance   . History of shingles   . Wrist injury   . Shoulder injury     Past Surgical History  Procedure Laterality Date  . Appendectomy  1949  . Tonsillectomy  1956  . Abdominal hysterectomy  1974  . Tibia fracture surgery  1995    rod placed   . Wrist fusion with iliac crest bone graft  2005  . Cataracts      lazer sx 2011 & 2012  . Eye surgery  2014    eye lid sx    Patient Active Problem List   Diagnosis Date Noted  . Hypothyroid 06/29/2014  . Ejection fraction   . Troponin level elevated 04/14/2014  . CKD (chronic kidney disease) stage 4, GFR 15-29 ml/min (HCC) 04/13/2014  . Chest pain with moderate risk of acute coronary syndrome 04/12/2014  . Carotid artery disease without cerebral infarction ([place]) 06/13/2013  . Left carotid bruit 06/04/2013  . Preop cardiovascular exam   . Statin  intolerance   . Multiple thyroid nodules   . Retroperitoneal bleed   . HTN (hypertension)   . Dyslipidemia   . Bradycardia 05/19/2010  . SOB (shortness of breath) 05/19/2010  . CAD S/P LAD DES '09. Myoview low risk 04/12/14 03/21/2007      Current Outpatient Prescriptions  Medication Sig Dispense Refill  . amLODipine (NORVASC) 5 MG tablet Take 1 tablet (5 mg total) by mouth daily. 90 tablet 4  . aspirin 81 MG tablet Take 81 mg by mouth daily.     Marland Kitchen dicyclomine (BENTYL) 10 MG capsule Take 10 mg by mouth daily as needed for spasms.     Marland Kitchen estradiol (ESTRACE) 0.5 MG tablet Take 0.5 mg by mouth every Monday, Wednesday, and Friday.    . nitroGLYCERIN (NITROSTAT) 0.4 MG SL tablet Place 1 tablet (0.4 mg total) under the tongue every 5 (five) minutes x  3 doses as needed for chest pain. 25 tablet 2   No current facility-administered medications for this visit.    Allergies:   Bystolic; Ciprofloxacin; Clindamycin; Cephalexin; Codeine; Hydrocodone-acetaminophen; Oxycodone hcl; Propoxyphene hcl; Propoxyphene n-acetaminophen; Statins; Tetanus toxoid; Tramadol; Levothyroxine sodium; Nitrofuran derivatives; Penicillins; and Tetracycline    Social History:  The patient  reports that she has never smoked. She does not have any smokeless tobacco history on file. She reports that she does not drink alcohol or use illicit drugs.   Family History:  The patient's family history includes Cancer in an other family member; Coronary artery disease in an other family member; Heart attack in her mother; Pneumonia in her father.    ROS:  Please see the history of present illness.    Patient denies fever, chills, headache, sweats, rash, change in vision, change in hearing, chest pain, cough, nausea or vomiting, urinary symptoms. All other systems are reviewed and are negative.    PHYSICAL EXAM: VS:  BP 130/70 mmHg  Pulse 55  Ht 5\' 4"  (1.626 m)  Wt 161 lb (73.029 kg)  BMI 27.62 kg/m2 , Patient is oriented to person time and place. Affect is normal. Head is atraumatic. Sclera and conjunctiva are normal. There is no jugular venous distention. Lungs are clear. Respiratory effort is nonlabored. Cardiac exam reveals S1 and S2. The abdomen is soft. There is no peripheral edema. There are no musculoskeletal deformities. There are no skin rashes.  EKG:   EKG is not done today.   Recent Labs: 04/14/2014: ALT 10; BUN 28*; Creatinine, Ser 1.87*; Hemoglobin 11.2*; Magnesium 2.3; Platelets 207; Potassium 4.1; Sodium 141 02/08/2015: TSH 5.48*    Lipid Panel    Component Value Date/Time   CHOL 191 04/12/2014 0120   TRIG 100 04/12/2014 0120   HDL 40 04/12/2014 0120   CHOLHDL 4.8 04/12/2014 0120   VLDL 20 04/12/2014 0120   LDLCALC 131* 04/12/2014 0120      Wt  Readings from Last 3 Encounters:  02/24/15 161 lb (73.029 kg)  02/08/15 162 lb 6.4 oz (73.664 kg)  11/12/14 155 lb (70.308 kg)    EKG on 02/24/2015 - sinus vertical cardia with ventricular rate 55 bpm, negative T waves in the inferior and anterolateral leads that are unchanged from 06/08/2014.  ASSESSMENT AND PLAN:  1. Bradycardia  - the patient is asymptomatic, recently seen by Dr. Rayann Heman and doesn't require a pacemaker placed. We will just follow. She was seen by endocrinologist who started her on low-dose Synthroid that she didn't tolerate well but repeat TSH was normal she is currently off any medications.  2. CAD Stable, Her last nuclear study was January, 2016.unchanged EKG since then as described above.  3. HTN -  Well controlled.  4. Hypothyroidism - resolved.  Follow-up in 6 months.  Dorothy Spark 02/24/2015

## 2015-03-05 ENCOUNTER — Other Ambulatory Visit: Payer: Self-pay | Admitting: Cardiology

## 2015-03-05 NOTE — Telephone Encounter (Signed)
Rx request sent to pharmacy.  

## 2015-03-10 DIAGNOSIS — M7542 Impingement syndrome of left shoulder: Secondary | ICD-10-CM | POA: Diagnosis not present

## 2015-03-10 DIAGNOSIS — M25512 Pain in left shoulder: Secondary | ICD-10-CM | POA: Diagnosis not present

## 2015-03-10 DIAGNOSIS — M7522 Bicipital tendinitis, left shoulder: Secondary | ICD-10-CM | POA: Diagnosis not present

## 2015-03-30 ENCOUNTER — Ambulatory Visit: Payer: Self-pay | Admitting: Internal Medicine

## 2015-04-07 DIAGNOSIS — M7542 Impingement syndrome of left shoulder: Secondary | ICD-10-CM | POA: Diagnosis not present

## 2015-05-20 ENCOUNTER — Ambulatory Visit (INDEPENDENT_AMBULATORY_CARE_PROVIDER_SITE_OTHER): Payer: Medicare Other | Admitting: Internal Medicine

## 2015-05-20 ENCOUNTER — Encounter: Payer: Self-pay | Admitting: Internal Medicine

## 2015-05-20 VITALS — BP 120/78 | HR 56 | Temp 98.0°F | Resp 16 | Wt 165.0 lb

## 2015-05-20 DIAGNOSIS — N184 Chronic kidney disease, stage 4 (severe): Secondary | ICD-10-CM

## 2015-05-20 DIAGNOSIS — I10 Essential (primary) hypertension: Secondary | ICD-10-CM | POA: Diagnosis not present

## 2015-05-20 NOTE — Assessment & Plan Note (Signed)
Getting records to see if the cause was determined. Sees [facility] currently. Not on epo shots or phosphate binders. BP at goal and checking records for history of diabetes (she denies).

## 2015-05-20 NOTE — Progress Notes (Signed)
Pre visit review using our clinic review tool, if applicable. No additional management support is needed unless otherwise documented below in the visit note. 

## 2015-05-20 NOTE — Progress Notes (Signed)
   Subjective:    Patient ID: Vanessa [name], female    DOB: 01/27/1928, 82 y.o.   MRN: [id number]  HPI The patient is a new 82 YO female coming in for her CKD stage 4. She is not sure exactly the cause but it was found after extensive workup for some stomach pain about 1 year ago and she was diagnosed by the hospital. This caused her to lose faith in her previous doctor as she felt it should have been caught sooner. She does not have high blood pressure and she has been fairly healthy. Rare aleve usage (previously) none since diagnosis. No diabetes or history of same. Does have CAD s/p stent 2009. Getting shoulder surgery later this month.   PMH, [facility], social history reviewed and updated.   Review of Systems  Constitutional: Negative for fever, activity change, appetite change, fatigue and unexpected weight change.  HENT: Negative.   Eyes: Negative.   Respiratory: Negative for chest tightness, shortness of breath, wheezing and stridor.   Cardiovascular: Negative for chest pain, palpitations and leg swelling.  Gastrointestinal: Negative for nausea, abdominal pain, diarrhea, constipation and abdominal distention.  Musculoskeletal: Positive for arthralgias. Negative for myalgias, back pain and gait problem.  Skin: Negative.   Neurological: Negative for dizziness, seizures, weakness, light-headedness and numbness.  Psychiatric/Behavioral: Negative.       Objective:   Physical Exam  Constitutional: She is oriented to person, place, and time. She appears well-developed and well-nourished.  HENT:  Head: Normocephalic and atraumatic.  Eyes: EOM are normal.  Neck: Normal range of motion.  Cardiovascular: Normal rate and regular rhythm.   Pulmonary/Chest: Effort normal and breath sounds normal. No respiratory distress. She has no wheezes. She has no rales. She exhibits no tenderness.  Abdominal: Soft. Bowel sounds are normal. She exhibits no distension. There is no tenderness. There is no  rebound.  Musculoskeletal: She exhibits no edema.  Neurological: She is alert and oriented to person, place, and time. Coordination normal.  Skin: Skin is warm and dry.  Psychiatric: She has a normal mood and affect.   Filed Vitals:   05/20/15 1502  BP: 120/78  Pulse: 56  Temp: 98 F (36.7 C)  TempSrc: Oral  Resp: 16  Weight: 165 lb (74.844 kg)  SpO2: 95%      Assessment & Plan:

## 2015-05-20 NOTE — Patient Instructions (Signed)
We are getting your records and will update your records.   Come back in about 6-12 months for a physical.   Get back to exercise once your shoulder has healed.

## 2015-05-20 NOTE — Assessment & Plan Note (Signed)
BP at goal on amlodipine 5 mg daily. Many allergies which were reviewed today with the patient. Seeing nephrology and recent labs with them that we will get. Complicated by CKD stage 4 although this is not likely the sole cause of the CKD per patient.

## 2015-05-21 DIAGNOSIS — N2581 Secondary hyperparathyroidism of renal origin: Secondary | ICD-10-CM | POA: Diagnosis not present

## 2015-05-21 DIAGNOSIS — N179 Acute kidney failure, unspecified: Secondary | ICD-10-CM | POA: Diagnosis not present

## 2015-05-21 DIAGNOSIS — N184 Chronic kidney disease, stage 4 (severe): Secondary | ICD-10-CM | POA: Diagnosis not present

## 2015-05-21 DIAGNOSIS — I129 Hypertensive chronic kidney disease with stage 1 through stage 4 chronic kidney disease, or unspecified chronic kidney disease: Secondary | ICD-10-CM | POA: Diagnosis not present

## 2015-05-21 DIAGNOSIS — D631 Anemia in chronic kidney disease: Secondary | ICD-10-CM | POA: Diagnosis not present

## 2015-05-21 DIAGNOSIS — N189 Chronic kidney disease, unspecified: Secondary | ICD-10-CM | POA: Diagnosis not present

## 2015-05-25 DIAGNOSIS — M25512 Pain in left shoulder: Secondary | ICD-10-CM | POA: Diagnosis not present

## 2015-05-25 DIAGNOSIS — M75102 Unspecified rotator cuff tear or rupture of left shoulder, not specified as traumatic: Secondary | ICD-10-CM | POA: Diagnosis not present

## 2015-05-25 DIAGNOSIS — G8918 Other acute postprocedural pain: Secondary | ICD-10-CM | POA: Diagnosis not present

## 2015-05-25 DIAGNOSIS — R600 Localized edema: Secondary | ICD-10-CM | POA: Diagnosis not present

## 2015-05-25 DIAGNOSIS — M24112 Other articular cartilage disorders, left shoulder: Secondary | ICD-10-CM | POA: Diagnosis not present

## 2015-05-25 DIAGNOSIS — M7502 Adhesive capsulitis of left shoulder: Secondary | ICD-10-CM | POA: Diagnosis not present

## 2015-05-25 DIAGNOSIS — M7522 Bicipital tendinitis, left shoulder: Secondary | ICD-10-CM | POA: Diagnosis not present

## 2015-05-25 DIAGNOSIS — M19012 Primary osteoarthritis, left shoulder: Secondary | ICD-10-CM | POA: Diagnosis not present

## 2015-05-25 DIAGNOSIS — M7542 Impingement syndrome of left shoulder: Secondary | ICD-10-CM | POA: Diagnosis not present

## 2015-06-04 DIAGNOSIS — M25512 Pain in left shoulder: Secondary | ICD-10-CM | POA: Diagnosis not present

## 2015-06-21 ENCOUNTER — Telehealth: Payer: Self-pay

## 2015-06-21 NOTE — Telephone Encounter (Signed)
Recd faxed rx request for estradiol tab from [facility]---patient previously had prescribed by dr hodges---patient is wanting to know if you would now prescribe for her?---copy of fax is at Amdrew Oboyle's desk----please advise, thanks---patient stated she could wait until dr crawford returned back to office next week before refill was needed

## 2015-06-28 MED ORDER — ESTRADIOL 0.5 MG PO TABS: 0.5000 mg | ORAL_TABLET | ORAL | Status: DC

## 2015-06-28 NOTE — Addendum Note (Signed)
Addended by: Pricilla Holm A on: 06/28/2015 07:54 AM   Modules accepted: Orders

## 2015-06-28 NOTE — Telephone Encounter (Signed)
Will send in refill of the estradiol and we can discuss at next visit.

## 2015-06-28 NOTE — Telephone Encounter (Signed)
Patient advised that rx for estradiol was sent to [facility] and would discuss with dr crawford at next office visit

## 2015-07-07 DIAGNOSIS — M25551 Pain in right hip: Secondary | ICD-10-CM | POA: Diagnosis not present

## 2015-07-07 DIAGNOSIS — M25552 Pain in left hip: Secondary | ICD-10-CM | POA: Diagnosis not present

## 2015-07-07 DIAGNOSIS — M7062 Trochanteric bursitis, left hip: Secondary | ICD-10-CM | POA: Diagnosis not present

## 2015-07-07 DIAGNOSIS — M7522 Bicipital tendinitis, left shoulder: Secondary | ICD-10-CM | POA: Diagnosis not present

## 2015-08-09 ENCOUNTER — Ambulatory Visit (INDEPENDENT_AMBULATORY_CARE_PROVIDER_SITE_OTHER): Payer: Medicare Other | Admitting: Endocrinology

## 2015-08-09 ENCOUNTER — Encounter: Payer: Self-pay | Admitting: Endocrinology

## 2015-08-09 ENCOUNTER — Ambulatory Visit: Payer: Self-pay | Admitting: Endocrinology

## 2015-08-09 VITALS — BP 124/78 | HR 51 | Temp 98.1°F | Resp 14 | Ht 64.0 in | Wt 163.8 lb

## 2015-08-09 DIAGNOSIS — E038 Other specified hypothyroidism: Secondary | ICD-10-CM | POA: Diagnosis not present

## 2015-08-09 LAB — TSH: TSH: 6.57 u[IU]/mL — ABNORMAL HIGH (ref 0.35–4.50)

## 2015-08-09 LAB — T4, FREE: FREE T4: 0.62 ng/dL (ref 0.60–1.60)

## 2015-08-09 NOTE — Progress Notes (Signed)
Quick Note:  Please let patient know that the thyroid level is about the same, no treatment needed ______

## 2015-08-09 NOTE — Progress Notes (Signed)
Patient ID: Vanessa [name], female   DOB: [birth year], 82 y.o.   MRN: [id number]            Reason for Appointment:  Hypothyroidism     History of Present Illness:   The Hypothyroidism was first diagnosed in 2009 At that time she was being evaluated for her thyroid enlargement and was told that she had thyroid nodules No details about this evaluation are available and she did not have thyroid biopsy Subsequently about 7 years ago she was told that her blood test showed an underactive thyroid and appears that her TSH was 7.5 She thinks she was given thyroid supplements to try although not clear if she was having any symptoms of hypothyroidism She was tried on levothyroxine, Synthroid and Cytomel also but she apparently had side effects from all of these Reportedly she had symptoms of her throat closing up when she took Synthroid; with levothyroxine she had palpitations, skin itching, headaches and sweating and with liothyronine she was having itching  Patient was left untreated subsequently Apparently TSH was normal in 2012, not clear if she was taking any medication at that time Since about October 2015 patient had been having marked fatigue without any significant cold intolerance, difficulty concentrating, dry skin, weight gain or any hair loss Previously was fairly active and exercising with water aerobics  RECENT history:  In 5/16 she was empirically started on levothyroxine 25 g, half tablet daily because of her symptoms of fatigue and TSH of 6-7 She took a half tablet for 10 days and then went on to 25 g daily However because of an episode of palpitations the medication was stopped  She has been observed without any thyroid supplements and she does not complain of any unusual fatigue She is still doing fairly well and her TSH has been fluctuating, on the last visit 5.5   Lab Results  Component Value Date   TSH 5.48* 02/08/2015   TSH 1.31 11/12/2014   TSH 6.335* 08/06/2014     FREET4 0.58* 02/08/2015   FREET4 0.68 11/12/2014   FREET4 0.81 08/06/2014      Past Medical History  Diagnosis Date  . CAD (coronary artery disease) 03/2007    DES to LAD, January, 2009 / stress echo, July, 2009 elsewhere, no ischemia  . Multiple thyroid nodules     small heterogenious nodules...OK  . Retroperitoneal bleed     post cath 2004-surgery  . HTN (hypertension)   . Dyslipidemia   . Bradycardia 05/2010    March, 2012  . SOB (shortness of breath) 05/2010    March, 2012  . Statin intolerance   . Preop cardiovascular exam     Cardiac clearance for shoulder surgery October, 2013  . Hemorrhoids 1981  . Wrist injury     plate placed [id number] in rt wrist & removed 2008  . Shingles 07/19/2010  . Shoulder injury 2013    right   . Chronic kidney disease     stage IV  . Dyslipidemia   . Thyroid nodule   . Statin intolerance   . History of shingles   . Wrist injury   . Shoulder injury   . Cancer [facility])     skin    Past Surgical History  Procedure Laterality Date  . Appendectomy  1949  . Tonsillectomy  1956  . Abdominal hysterectomy  1974  . Tibia fracture surgery  1995    rod placed   . Wrist fusion with iliac crest bone  graft  2005  . Cataracts      lazer sx 2011 & 2012  . Eye surgery  2014    eye lid sx    Family History  Problem Relation Age of Onset  . Cancer    . Coronary artery disease    . Heart attack Mother   . Pneumonia Father     Social History:  reports that she has never smoked. She does not have any smokeless tobacco history on file. She reports that she does not drink alcohol or use illicit drugs.  Allergies:  Allergies  Allergen Reactions  . Bystolic USAA Hcl] Shortness Of Breath    SHORTNESS OF BREATH  . Ciprofloxacin Diarrhea  . Clindamycin Diarrhea  . Cephalexin Other (See Comments)    Red swollen face  . Codeine Nausea Only  . Hydrocodone-Acetaminophen Other (See Comments)    Hallucinations   . Oxycodone Hcl Other (See  Comments)    Hallucinations  . Propoxyphene Hcl Itching  . Propoxyphene N-Acetaminophen Itching  . Statins Other (See Comments)    "Made arms feel like they weighted a ton"  . Tetanus Toxoid Other (See Comments)    Whelps   . Tramadol Nausea And Vomiting  . Levothyroxine Sodium Itching, Palpitations and Other (See Comments)    Headache ----PT STATED SHE HAS STARTED THIS MEDICATION RECENTLY WITH NO SIDE EFFECTS  . Nitrofuran Derivatives Diarrhea, Palpitations and Other (See Comments)     dizziness   . Penicillins Rash  . Tetracycline Rash and Other (See Comments)    whelps      Medication List       This list is accurate as of: 08/09/15 10:09 AM.  Always use your most recent med list.               amLODipine 5 MG tablet  Commonly known as:  NORVASC  Take 1 tablet by mouth  daily     aspirin 81 MG tablet  Take 81 mg by mouth daily.     dicyclomine 10 MG capsule  Commonly known as:  BENTYL  Take 10 mg by mouth daily as needed for spasms.     estradiol 0.5 MG tablet  Commonly known as:  ESTRACE  Take 1 tablet (0.5 mg total) by mouth every Monday, Wednesday, and Friday.     nitroGLYCERIN 0.4 MG SL tablet  Commonly known as:  NITROSTAT  Place 1 tablet (0.4 mg total) under the tongue every 5 (five) minutes x 3 doses as needed for chest pain.     NUCYNTA 50 MG Tabs tablet  Generic drug:  tapentadol  Take 50 mg by mouth every 4 (four) hours as needed. Reported on 08/09/2015        Review of Systems:  She is very consistent with healthy diet ,Weight is stable   Wt Readings from Last 3 Encounters:  08/09/15 163 lb 12.8 oz (74.299 kg)  05/20/15 165 lb (74.844 kg)  02/24/15 161 lb (73.029 kg)    CARDIOLOGY:  she has a  history of high blood pressure treated with Norvasc .          PHYSICAL Examination:    BP 124/78 mmHg  Pulse 51  Temp(Src) 98.1 F (36.7 C)  Resp 14  Ht 5\' 4"  (1.626 m)  Wt 163 lb 12.8 oz (74.299 kg)  BMI 28.10 kg/m2  SpO2  95%  Thyroid not palpable. Biceps reflexes are normal   Assessment/Plan   PRIMARY HYPOTHYROIDISM, mild and likely  subclinical  Previously he had symptoms of fatigue but this did not improve or correlate with her thyroid levels Also because of episode of  palpitations  she had been reluctant to try thyroid supplement again  Clinically she is doing well without any unusual fatigue now  Will check her TSH again and decide on further treatment and follow-up Most likely unless her TSH is at least over 10 will not need supplementation  Meghana Tullo 08/09/2015, 10:09 AM    Addendum: TSH 6.6, can continue to observe  Note: This office note was prepared with Estate agent. Any transcriptional errors that result from this process are unintentional.

## 2015-08-18 DIAGNOSIS — Z4789 Encounter for other orthopedic aftercare: Secondary | ICD-10-CM | POA: Diagnosis not present

## 2015-09-02 ENCOUNTER — Encounter: Payer: Self-pay | Admitting: Internal Medicine

## 2015-09-02 ENCOUNTER — Ambulatory Visit (INDEPENDENT_AMBULATORY_CARE_PROVIDER_SITE_OTHER): Payer: Medicare Other | Admitting: Internal Medicine

## 2015-09-02 VITALS — BP 128/70 | HR 55 | Temp 98.3°F | Resp 12 | Ht 64.0 in | Wt 165.8 lb

## 2015-09-02 DIAGNOSIS — I10 Essential (primary) hypertension: Secondary | ICD-10-CM

## 2015-09-02 DIAGNOSIS — N184 Chronic kidney disease, stage 4 (severe): Secondary | ICD-10-CM

## 2015-09-02 DIAGNOSIS — E039 Hypothyroidism, unspecified: Secondary | ICD-10-CM | POA: Diagnosis not present

## 2015-09-02 DIAGNOSIS — Z0000 Encounter for general adult medical examination without abnormal findings: Secondary | ICD-10-CM

## 2015-09-02 NOTE — Patient Instructions (Addendum)
We do not need any blood work today.   Health Maintenance, Female Adopting a healthy lifestyle and getting preventive care can go a long way to promote health and wellness. Talk with your health care provider about what schedule of regular examinations is right for you. This is a good chance for you to check in with your provider about disease prevention and staying healthy. In between checkups, there are plenty of things you can do on your own. Experts have done a lot of research about which lifestyle changes and preventive measures are most likely to keep you healthy. Ask your health care provider for more information. WEIGHT AND DIET  Eat a healthy diet  Be sure to include plenty of vegetables, fruits, low-fat dairy products, and lean protein.  Do not eat a lot of foods high in solid fats, added sugars, or salt.  Get regular exercise. This is one of the most important things you can do for your health.  Most adults should exercise for at least 150 minutes each week. The exercise should increase your heart rate and make you sweat (moderate-intensity exercise).  Most adults should also do strengthening exercises at least twice a week. This is in addition to the moderate-intensity exercise.  Maintain a healthy weight  Body mass index (BMI) is a measurement that can be used to identify possible weight problems. It estimates body fat based on height and weight. Your health care provider can help determine your BMI and help you achieve or maintain a healthy weight.  For females 39 years of age and older:   A BMI below 18.5 is considered underweight.  A BMI of 18.5 to 24.9 is normal.  A BMI of 25 to 29.9 is considered overweight.  A BMI of 30 and above is considered obese.  Watch levels of cholesterol and blood lipids  You should start having your blood tested for lipids and cholesterol at 80 years of age, then have this test every 5 years.  You may need to have your cholesterol  levels checked more often if:  Your lipid or cholesterol levels are high.  You are older than 80 years of age.  You are at high risk for heart disease.  CANCER SCREENING   Lung Cancer  Lung cancer screening is recommended for adults 80-80 years old who are at high risk for lung cancer because of a history of smoking.  A yearly low-dose CT scan of the lungs is recommended for people who:  Currently smoke.  Have quit within the past 15 years.  Have at least a 30-pack-year history of smoking. A pack year is smoking an average of one pack of cigarettes a day for 1 year.  Yearly screening should continue until it has been 15 years since you quit.  Yearly screening should stop if you develop a health problem that would prevent you from having lung cancer treatment.  Breast Cancer  Practice breast self-awareness. This means understanding how your breasts normally appear and feel.  It also means doing regular breast self-exams. Let your health care provider know about any changes, no matter how small.  If you are in your 20s or 30s, you should have a clinical breast exam (CBE) by a health care provider every 1-3 years as part of a regular health exam.  If you are 36 or older, have a CBE every year. Also consider having a breast X-ray (mammogram) every year.  If you have a family history of breast cancer, talk to  your health care provider about genetic screening.  If you are at high risk for breast cancer, talk to your health care provider about having an MRI and a mammogram every year.  Breast cancer gene (BRCA) assessment is recommended for women who have family members with BRCA-related cancers. BRCA-related cancers include:  Breast.  Ovarian.  Tubal.  Peritoneal cancers.  Results of the assessment will determine the need for genetic counseling and BRCA1 and BRCA2 testing. Cervical Cancer Your health care provider may recommend that you be screened regularly for cancer  of the pelvic organs (ovaries, uterus, and vagina). This screening involves a pelvic examination, including checking for microscopic changes to the surface of your cervix (Pap test). You may be encouraged to have this screening done every 3 years, beginning at age 21.  For women ages 30-65, health care providers may recommend pelvic exams and Pap testing every 3 years, or they may recommend the Pap and pelvic exam, combined with testing for human papilloma virus (HPV), every 5 years. Some types of HPV increase your risk of cervical cancer. Testing for HPV may also be done on women of any age with unclear Pap test results.  Other health care providers may not recommend any screening for nonpregnant women who are considered low risk for pelvic cancer and who do not have symptoms. Ask your health care provider if a screening pelvic exam is right for you.  If you have had past treatment for cervical cancer or a condition that could lead to cancer, you need Pap tests and screening for cancer for at least 20 years after your treatment. If Pap tests have been discontinued, your risk factors (such as having a new sexual partner) need to be reassessed to determine if screening should resume. Some women have medical problems that increase the chance of getting cervical cancer. In these cases, your health care provider may recommend more frequent screening and Pap tests. Colorectal Cancer  This type of cancer can be detected and often prevented.  Routine colorectal cancer screening usually begins at 80 years of age and continues through 80 years of age.  Your health care provider may recommend screening at an earlier age if you have risk factors for colon cancer.  Your health care provider may also recommend using home test kits to check for hidden blood in the stool.  A small camera at the end of a tube can be used to examine your colon directly (sigmoidoscopy or colonoscopy). This is done to check for the  earliest forms of colorectal cancer.  Routine screening usually begins at age 50.  Direct examination of the colon should be repeated every 5-10 years through 80 years of age. However, you may need to be screened more often if early forms of precancerous polyps or small growths are found. Skin Cancer  Check your skin from head to toe regularly.  Tell your health care provider about any new moles or changes in moles, especially if there is a change in a mole's shape or color.  Also tell your health care provider if you have a mole that is larger than the size of a pencil eraser.  Always use sunscreen. Apply sunscreen liberally and repeatedly throughout the day.  Protect yourself by wearing long sleeves, pants, a wide-brimmed hat, and sunglasses whenever you are outside. HEART DISEASE, DIABETES, AND HIGH BLOOD PRESSURE   High blood pressure causes heart disease and increases the risk of stroke. High blood pressure is more likely to develop in:    People who have blood pressure in the high end of the normal range (130-139/85-89 mm Hg).  People who are overweight or obese.  People who are African American.  If you are 98-80 years of age, have your blood pressure checked every 3-5 years. If you are 29 years of age or older, have your blood pressure checked every year. You should have your blood pressure measured twice--once when you are at a hospital or clinic, and once when you are not at a hospital or clinic. Record the average of the two measurements. To check your blood pressure when you are not at a hospital or clinic, you can use:  An automated blood pressure machine at a pharmacy.  A home blood pressure monitor.  If you are between 55 years and 53 years old, ask your health care provider if you should take aspirin to prevent strokes.  Have regular diabetes screenings. This involves taking a blood sample to check your fasting blood sugar level.  If you are at a normal weight and  have a low risk for diabetes, have this test once every three years after 80 years of age.  If you are overweight and have a high risk for diabetes, consider being tested at a younger age or more often. PREVENTING INFECTION  Hepatitis B  If you have a higher risk for hepatitis B, you should be screened for this virus. You are considered at high risk for hepatitis B if:  You were born in a country where hepatitis B is common. Ask your health care provider which countries are considered high risk.  Your parents were born in a high-risk country, and you have not been immunized against hepatitis B (hepatitis B vaccine).  You have HIV or AIDS.  You use needles to inject street drugs.  You live with someone who has hepatitis B.  You have had sex with someone who has hepatitis B.  You get hemodialysis treatment.  You take certain medicines for conditions, including cancer, organ transplantation, and autoimmune conditions. Hepatitis C  Blood testing is recommended for:  Everyone born from [birth year] through 1963.  Anyone with known risk factors for hepatitis C. Sexually transmitted infections (STIs)  You should be screened for sexually transmitted infections (STIs) including gonorrhea and chlamydia if:  You are sexually active and are younger than 80 years of age.  You are older than 80 years of age and your health care provider tells you that you are at risk for this type of infection.  Your sexual activity has changed since you were last screened and you are at an increased risk for chlamydia or gonorrhea. Ask your health care provider if you are at risk.  If you do not have HIV, but are at risk, it may be recommended that you take a prescription medicine daily to prevent HIV infection. This is called pre-exposure prophylaxis (PrEP). You are considered at risk if:  You are sexually active and do not regularly use condoms or know the HIV status of your partner(s).  You take drugs by  injection.  You are sexually active with a partner who has HIV. Talk with your health care provider about whether you are at high risk of being infected with HIV. If you choose to begin PrEP, you should first be tested for HIV. You should then be tested every 3 months for as long as you are taking PrEP.  PREGNANCY   If you are premenopausal and you may become pregnant, ask your health  care provider about preconception counseling.  If you may become pregnant, take 400 to 800 micrograms (mcg) of folic acid every day.  If you want to prevent pregnancy, talk to your health care provider about birth control (contraception). OSTEOPOROSIS AND MENOPAUSE   Osteoporosis is a disease in which the bones lose minerals and strength with aging. This can result in serious bone fractures. Your risk for osteoporosis can be identified using a bone density scan.  If you are 65 years of age or older, or if you are at risk for osteoporosis and fractures, ask your health care provider if you should be screened.  Ask your health care provider whether you should take a calcium or vitamin D supplement to lower your risk for osteoporosis.  Menopause may have certain physical symptoms and risks.  Hormone replacement therapy may reduce some of these symptoms and risks. Talk to your health care provider about whether hormone replacement therapy is right for you.  HOME CARE INSTRUCTIONS   Schedule regular health, dental, and eye exams.  Stay current with your immunizations.   Do not use any tobacco products including cigarettes, chewing tobacco, or electronic cigarettes.  If you are pregnant, do not drink alcohol.  If you are breastfeeding, limit how much and how often you drink alcohol.  Limit alcohol intake to no more than 1 drink per day for nonpregnant women. One drink equals 12 ounces of beer, 5 ounces of wine, or 1 ounces of hard liquor.  Do not use street drugs.  Do not share needles.  Ask your  health care provider for help if you need support or information about quitting drugs.  Tell your health care provider if you often feel depressed.  Tell your health care provider if you have ever been abused or do not feel safe at home.   This information is not intended to replace advice given to you by your health care provider. Make sure you discuss any questions you have with your health care provider.   Document Released: 09/19/2010 Document Revised: 03/27/2014 Document Reviewed: 02/05/2013 Elsevier Interactive Patient Education 2016 [employer]  

## 2015-09-02 NOTE — Progress Notes (Signed)
Pre visit review using our clinic review tool, if applicable. No additional management support is needed unless otherwise documented below in the visit note. 

## 2015-09-02 NOTE — Progress Notes (Signed)
   Subjective:    Patient ID: Vanessa [name], female    DOB: Aug 18, 1927, 82 y.o.   MRN: [id number]  HPI Here for medicare wellness, no new complaints. Please see A/P for status and treatment of chronic medical problems.   Diet: heart healthy  Physical activity: sedentary, water aerobics Depression/mood screen: negative Hearing: intact to whispered voice Visual acuity: grossly normal, performs annual eye exam  ADLs: capable Fall risk: low Home safety: good Cognitive evaluation: intact to orientation, naming, recall and repetition EOL planning: adv directives discussed, in place  I have personally reviewed and have noted 1. The patient's medical and social history - reviewed today no changes 2. Their use of alcohol, tobacco or illicit drugs 3. Their current medications and supplements 4. The patient's functional ability including ADL's, fall risks, home safety risks and hearing or visual impairment. 5. Diet and physical activities 6. Evidence for depression or mood disorders 7. Care team reviewed and updated (available in snapshot)  Review of Systems  Constitutional: Negative for fever, activity change, appetite change, fatigue and unexpected weight change.  HENT: Negative.   Eyes: Negative.   Respiratory: Negative for chest tightness, shortness of breath, wheezing and stridor.   Cardiovascular: Negative for chest pain, palpitations and leg swelling.  Gastrointestinal: Negative for nausea, abdominal pain, diarrhea, constipation and abdominal distention.  Musculoskeletal: Positive for arthralgias. Negative for myalgias, back pain and gait problem.  Skin: Negative.   Neurological: Negative for dizziness, seizures, weakness, light-headedness and numbness.  Psychiatric/Behavioral: Negative.       Objective:   Physical Exam  Constitutional: She is oriented to person, place, and time. She appears well-developed and well-nourished.  HENT:  Head: Normocephalic and atraumatic.    Eyes: EOM are normal.  Neck: Normal range of motion.  Cardiovascular: Normal rate and regular rhythm.   Pulmonary/Chest: Effort normal and breath sounds normal. No respiratory distress. She has no wheezes. She has no rales. She exhibits no tenderness.  Abdominal: Soft. Bowel sounds are normal. She exhibits no distension. There is no tenderness. There is no rebound.  Musculoskeletal: She exhibits no edema.  Neurological: She is alert and oriented to person, place, and time. Coordination normal.  Skin: Skin is warm and dry.  Psychiatric: She has a normal mood and affect.   Filed Vitals:   09/02/15 1035  BP: 128/70  Pulse: 55  Temp: 98.3 F (36.8 C)  TempSrc: Oral  Resp: 12  Height: 5\' 4"  (1.626 m)  Weight: 165 lb 12.8 oz (75.206 kg)  SpO2: 91%      Assessment & Plan:

## 2015-09-03 ENCOUNTER — Other Ambulatory Visit: Payer: Self-pay | Admitting: Cardiology

## 2015-09-03 DIAGNOSIS — Z0000 Encounter for general adult medical examination without abnormal findings: Secondary | ICD-10-CM | POA: Insufficient documentation

## 2015-09-03 NOTE — Assessment & Plan Note (Signed)
Gets monitoring with endo and not able to tolerate synthroid.

## 2015-09-03 NOTE — Assessment & Plan Note (Signed)
Not due for labs today, declines all overdue immunizations including pneumonia, shingles, and tetanus. She does not want a bone density and it is unclear with her CKD if it is truly indicated. Counseled on the need for exercise and for home safety. Given 10 year screening recommendations. Aged out of colonoscopy.

## 2015-09-03 NOTE — Assessment & Plan Note (Signed)
Seeing nephrology and overall stable.

## 2015-09-03 NOTE — Assessment & Plan Note (Signed)
BP at goal on amlodipine. She has had many medication intolerances.

## 2015-09-10 ENCOUNTER — Ambulatory Visit: Payer: Medicare Other | Admitting: Cardiology

## 2015-09-15 DIAGNOSIS — M9901 Segmental and somatic dysfunction of cervical region: Secondary | ICD-10-CM | POA: Diagnosis not present

## 2015-09-15 DIAGNOSIS — M5382 Other specified dorsopathies, cervical region: Secondary | ICD-10-CM | POA: Diagnosis not present

## 2015-09-15 DIAGNOSIS — M50322 Other cervical disc degeneration at C5-C6 level: Secondary | ICD-10-CM | POA: Diagnosis not present

## 2015-09-15 DIAGNOSIS — M62830 Muscle spasm of back: Secondary | ICD-10-CM | POA: Diagnosis not present

## 2015-09-15 DIAGNOSIS — M9902 Segmental and somatic dysfunction of thoracic region: Secondary | ICD-10-CM | POA: Diagnosis not present

## 2015-09-16 DIAGNOSIS — M9901 Segmental and somatic dysfunction of cervical region: Secondary | ICD-10-CM | POA: Diagnosis not present

## 2015-09-16 DIAGNOSIS — M5382 Other specified dorsopathies, cervical region: Secondary | ICD-10-CM | POA: Diagnosis not present

## 2015-09-16 DIAGNOSIS — M50322 Other cervical disc degeneration at C5-C6 level: Secondary | ICD-10-CM | POA: Diagnosis not present

## 2015-09-16 DIAGNOSIS — M62830 Muscle spasm of back: Secondary | ICD-10-CM | POA: Diagnosis not present

## 2015-09-16 DIAGNOSIS — M9902 Segmental and somatic dysfunction of thoracic region: Secondary | ICD-10-CM | POA: Diagnosis not present

## 2015-09-25 DIAGNOSIS — Z4789 Encounter for other orthopedic aftercare: Secondary | ICD-10-CM | POA: Diagnosis not present

## 2015-09-25 DIAGNOSIS — M25511 Pain in right shoulder: Secondary | ICD-10-CM | POA: Diagnosis not present

## 2015-10-08 DIAGNOSIS — I129 Hypertensive chronic kidney disease with stage 1 through stage 4 chronic kidney disease, or unspecified chronic kidney disease: Secondary | ICD-10-CM | POA: Diagnosis not present

## 2015-10-08 DIAGNOSIS — N2581 Secondary hyperparathyroidism of renal origin: Secondary | ICD-10-CM | POA: Diagnosis not present

## 2015-10-08 DIAGNOSIS — N390 Urinary tract infection, site not specified: Secondary | ICD-10-CM | POA: Diagnosis not present

## 2015-10-08 DIAGNOSIS — D631 Anemia in chronic kidney disease: Secondary | ICD-10-CM | POA: Diagnosis not present

## 2015-10-08 DIAGNOSIS — N179 Acute kidney failure, unspecified: Secondary | ICD-10-CM | POA: Diagnosis not present

## 2015-10-08 DIAGNOSIS — N184 Chronic kidney disease, stage 4 (severe): Secondary | ICD-10-CM | POA: Diagnosis not present

## 2015-10-22 ENCOUNTER — Telehealth: Payer: Self-pay | Admitting: *Deleted

## 2015-10-22 NOTE — Telephone Encounter (Signed)
Rec'd fax pt is requesting 90 day on her Dicyclomine 90 day sent to [facility]...Johny Chess

## 2015-10-26 MED ORDER — DICYCLOMINE HCL 10 MG PO CAPS: 10.0000 mg | ORAL_CAPSULE | Freq: Every day | ORAL | 3 refills | Status: DC | PRN

## 2015-10-26 NOTE — Telephone Encounter (Signed)
Sent in for her

## 2015-11-08 DIAGNOSIS — M25511 Pain in right shoulder: Secondary | ICD-10-CM | POA: Diagnosis not present

## 2015-11-08 DIAGNOSIS — M7062 Trochanteric bursitis, left hip: Secondary | ICD-10-CM | POA: Diagnosis not present

## 2015-11-08 DIAGNOSIS — M7061 Trochanteric bursitis, right hip: Secondary | ICD-10-CM | POA: Diagnosis not present

## 2015-12-09 ENCOUNTER — Ambulatory Visit (INDEPENDENT_AMBULATORY_CARE_PROVIDER_SITE_OTHER): Payer: Medicare Other | Admitting: Cardiology

## 2015-12-09 VITALS — BP 122/80 | HR 47 | Wt 165.0 lb

## 2015-12-09 DIAGNOSIS — E039 Hypothyroidism, unspecified: Secondary | ICD-10-CM

## 2015-12-09 DIAGNOSIS — I2510 Atherosclerotic heart disease of native coronary artery without angina pectoris: Secondary | ICD-10-CM | POA: Diagnosis not present

## 2015-12-09 DIAGNOSIS — R001 Bradycardia, unspecified: Secondary | ICD-10-CM | POA: Diagnosis not present

## 2015-12-09 DIAGNOSIS — I2583 Coronary atherosclerosis due to lipid rich plaque: Secondary | ICD-10-CM

## 2015-12-09 DIAGNOSIS — Z9861 Coronary angioplasty status: Secondary | ICD-10-CM

## 2015-12-09 DIAGNOSIS — I952 Hypotension due to drugs: Secondary | ICD-10-CM | POA: Diagnosis not present

## 2015-12-09 DIAGNOSIS — I10 Essential (primary) hypertension: Secondary | ICD-10-CM

## 2015-12-09 NOTE — Progress Notes (Signed)
Patient ID: Vanessa [name], female   DOB: 22-Jan-1928, 82 y.o.   MRN: [id number]      Cardiology Office Note   Date:  12/09/2015   ID:  Vanessa [name], DOB [birth year], MRN [id number]  PCP:  Hoyt Koch, MD  Cardiologist:  Ena Dawley, MD   Follow up for bradycardia and coronary artery disease.  History of Present Illness: Vanessa [name] is a 82 y.o. female who presents today to follow-up coronary disease and bradycardia. I saw her last March, 2016. I referred her to Dr. Rayann Heman to help with the assessment of bradycardia. He noted that she needed further assessment of her thyroid. She was assessed very nicely by Dr. Dwyane Dee who put her on a very small dose of thyroid replacement. She is tolerating this and the dose will be increased over time. Her heart rate continues in the range of 45-50. She is feeling better. Originally she had exertional shortness of breath. I was concerned that this was from chronotropic incompetence. She is feeling better. The ultimate plan will be to follow her heart rate when her thyroid is completely normalized.  02/24/2015 - this is a 6 months follow-up for coronary artery disease in sinus bradycardia. Patient denies any symptoms of chest pain, shortness of breath, lower extremity edema orthopnea or proximal nocturnal dyspnea. She was seen by Dr. Rayann Heman who stated that at this time she doesn't qualify for pacemaker. She states she doesn't feel any dizziness and denies any presyncope or syncope.She didn't need to use any sublingual nitroglycerin.  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 She has noticed fatigue, no dizziness and held amlodipine in the last week with improvement of symptoms. No syncope, no LE edema, orthopnea.   Past Medical History:  Diagnosis Date  . Bradycardia 05/2010   March, 2012  . CAD (coronary artery disease)  03/2007   DES to LAD, January, 2009 / stress echo, July, 2009 elsewhere, no ischemia  . Cancer ([place])    skin  . Chronic kidney disease    stage IV  . Dyslipidemia   . Dyslipidemia   . Hemorrhoids 1981  . History of shingles   . HTN (hypertension)   . Multiple thyroid nodules    small heterogenious nodules...OK  . Preop cardiovascular exam    Cardiac clearance for shoulder surgery October, 2013  . Retroperitoneal bleed    post cath 2004-surgery  . Shingles 07/19/2010  . Shoulder injury 2013   right   . Shoulder injury   . SOB (shortness of breath) 05/2010   March, 2012  . Statin intolerance   . Statin intolerance   . Thyroid nodule   . Wrist injury    plate placed [id number] in rt wrist & removed 2008  . Wrist injury     Past Surgical History:  Procedure Laterality Date  . ABDOMINAL HYSTERECTOMY  1974  . APPENDECTOMY  1949  . cataracts     lazer sx 2011 & 2012  . EYE SURGERY  2014   eye lid sx  . TIBIA FRACTURE SURGERY  1995   rod placed   . TONSILLECTOMY  1956  . WRIST FUSION WITH ILIAC CREST BONE GRAFT  2005    Patient Active Problem List   Diagnosis Date Noted  . Routine general medical examination at a health care facility 09/03/2015  .  Hypothyroid 06/29/2014  . CKD (chronic kidney disease) stage 4, GFR 15-29 ml/min ([facility]CC) 04/13/2014  . Carotid artery disease without cerebral infarction ([place]) 06/13/2013  . HTN (hypertension)   . Dyslipidemia   . Bradycardia 05/19/2010  . CAD S/P LAD DES '09. Myoview low risk 04/12/14 03/21/2007      Current Outpatient Prescriptions  Medication Sig Dispense Refill  . aspirin 81 MG tablet Take 81 mg by mouth daily.     Marland Kitchen estradiol (ESTRACE) 0.5 MG tablet Take 1 tablet (0.5 mg total) by mouth every Monday, Wednesday, and Friday. 45 tablet 3  . amLODipine (NORVASC) 5 MG tablet Take 1 tablet (5 mg total) by mouth daily. Please call and schedule a six month appointment per last office visit (Patient not taking: Reported on 12/09/2015)  90 tablet 1  . dicyclomine (BENTYL) 10 MG capsule Take 1 capsule (10 mg total) by mouth daily as needed for spasms. (Patient not taking: Reported on 12/09/2015) 90 capsule 3  . nitroGLYCERIN (NITROSTAT) 0.4 MG SL tablet Place 1 tablet (0.4 mg total) under the tongue every 5 (five) minutes x 3 doses as needed for chest pain. (Patient not taking: Reported on 12/09/2015) 25 tablet 2  . tapentadol (NUCYNTA) 50 MG TABS tablet Take 50 mg by mouth every 4 (four) hours as needed. Reported on 08/09/2015     No current facility-administered medications for this visit.     Allergies:   Bystolic [nebivolol hcl]; Ciprofloxacin; Clindamycin; Cephalexin; Codeine; Hydrocodone-acetaminophen; Oxycodone hcl; Propoxyphene hcl; Propoxyphene n-acetaminophen; Statins; Tetanus toxoid; Tramadol; Levothyroxine sodium; Nitrofuran derivatives; Penicillins; and Tetracycline    Social History:  The patient  reports that she has never smoked. She does not have any smokeless tobacco history on file. She reports that she does not drink alcohol or use drugs.   Family History:  The patient's family history includes Heart attack in her mother; Pneumonia in her father.    ROS:  Please see the history of present illness.    Patient denies fever, chills, headache, sweats, rash, change in vision, change in hearing, chest pain, cough, nausea or vomiting, urinary symptoms. All other systems are reviewed and are negative.    PHYSICAL EXAM: VS:  BP 122/80   Pulse (!) 47   Ht (P) 5\' 4"  (1.626 m)   Wt 165 lb (74.8 kg)   BMI (P) 28.32 kg/m  , Patient is oriented to person time and place. Affect is normal. Head is atraumatic. Sclera and conjunctiva are normal. There is no jugular venous distention. Lungs are clear. Respiratory effort is nonlabored. Cardiac exam reveals S1 and S2. The abdomen is soft. There is no peripheral edema. There are no musculoskeletal deformities. There are no skin rashes.  EKG:   EKG is not done today.   Recent  Labs: 08/09/2015: TSH 6.57    Lipid Panel    Component Value Date/Time   CHOL 191 04/12/2014 0120   TRIG 100 04/12/2014 0120   HDL 40 04/12/2014 0120   CHOLHDL 4.8 04/12/2014 0120   VLDL 20 04/12/2014 0120   LDLCALC 131 (H) 04/12/2014 0120      Wt Readings from Last 3 Encounters:  12/09/15 165 lb (74.8 kg)  09/02/15 165 lb 12.8 oz (75.2 kg)  08/09/15 163 lb 12.8 oz (74.3 kg)    EKG on 02/24/2015 - sinus vertical cardia with ventricular rate 55 bpm, negative T waves in the inferior and anterolateral leads that are unchanged from 06/08/2014.  TTE: 04/13/2014 - Left ventricle: The cavity size  was normal. Wall thickness was normal. The estimated ejection fraction was 60%. Wall motion was normal; there were no regional wall motion abnormalities. - Left atrium: The atrium was mildly dilated. - Right ventricle: The cavity size was normal. Systolic function was normal. - Pulmonary arteries: PA peak pressure: 40 mm Hg (S).    ASSESSMENT AND PLAN:  1. Bradycardia  - remains asymptomatic, recently seen by Dr. Rayann Heman and doesn't require a pacemaker placed. No We will just follow for now. She was seen by endocrinologist who started her on low-dose Synthroid that she didn't tolerate well but repeat TSH was normal she is currently off any medications.  2. CAD - asymptomatic, the last nuclear study was January, 2016.unchanged EKG since then as described above.  3. Hypotension due to drugs, we will discontinue amlodipine.   4. Hypothyroidism - resolved.  Follow-up in 1 year.  Ena Dawley 12/09/2015

## 2015-12-09 NOTE — Patient Instructions (Addendum)
Medication Instructions:   STOP TAKING AMLODIPINE NOW     Follow-Up:  Your physician wants you to follow-up in: [place] will receive a reminder letter in the mail two months in advance. If you don't receive a letter, please call our office to schedule the follow-up appointment.        If you need a refill on your cardiac medications before your next appointment, please call your pharmacy.

## 2015-12-13 ENCOUNTER — Telehealth: Payer: Self-pay | Admitting: Cardiology

## 2015-12-13 MED ORDER — AMLODIPINE BESYLATE 2.5 MG PO TABS: 2.5000 mg | ORAL_TABLET | Freq: Every day | ORAL | 1 refills | Status: DC

## 2015-12-13 NOTE — Telephone Encounter (Signed)
New message    Pt calling stating that she was taken off her BP med and had a bad wknd. She needs to speak to the nurse.

## 2015-12-13 NOTE — Telephone Encounter (Signed)
I would restart amlodipine 2.5 mg po daily and keep repeating her BP and let us know.

## 2015-12-13 NOTE — Telephone Encounter (Signed)
Pt calling to report to Dr Meda Coffee that ever since she took her off of amlodipine (for noted hypotension), she doesn't feel very well.  Pt states that on last Saturday morning she got up and took her BP and it was 94/60.  Pt stated that, that afternoon, she started feeling bad and had slight heaviness in her chest.  Pt stated she then took her BP and it was as high as 177/99 and HR-83.  Pt states she then took one of her amlodipine 5 mg po daily that Dr Meda Coffee d/c'ed, and then within an hour she started to feel much better.  Pt states her BP came down to 137/80 then, and the heaviness in her chest went away.  Pt reports her BP today is good at 137/80.  Pt states she would like for Dr Meda Coffee to advise on putting her back on a BP regimen, whether its starting her back on her amlodipine, or something else.  Informed the pt that I will route this message to Dr Meda Coffee to review and advise on,and follow-up with the pt thereafter.  Pt verbalized understanding and agrees with this plan.

## 2015-12-13 NOTE — Telephone Encounter (Signed)
Notified the pt that per Dr Meda Coffee, she recommends that we restart amlodipine at a low dose of 2.5 mg po daily, and she should keep repeating her BP and report abnormal values or concerns to our office.  Confirmed the pharmacy of choice with the pt.  Per the pt, she request for a month supply to be sent to her local pharmacy, and then she will switch this over to [facility], if this med is tolerated and when a refill is due.  Pt verbalized understanding and agrees with this plan.

## 2015-12-15 DIAGNOSIS — M7061 Trochanteric bursitis, right hip: Secondary | ICD-10-CM | POA: Diagnosis not present

## 2015-12-15 DIAGNOSIS — M7062 Trochanteric bursitis, left hip: Secondary | ICD-10-CM | POA: Diagnosis not present

## 2015-12-24 ENCOUNTER — Encounter: Payer: Self-pay | Admitting: Internal Medicine

## 2015-12-24 DIAGNOSIS — Z1231 Encounter for screening mammogram for malignant neoplasm of breast: Secondary | ICD-10-CM | POA: Diagnosis not present

## 2016-01-04 IMAGING — CR DG CHEST 2V
2 series · 2 of 2 positions shown · non-contrast
Comparison: None.

CLINICAL DATA: Chest pain, shortness of breath. Evaluate
mediastinal contour for possible aortic dissection per provided
history.

EXAM:
CHEST  2 VIEW

[w chest pa]
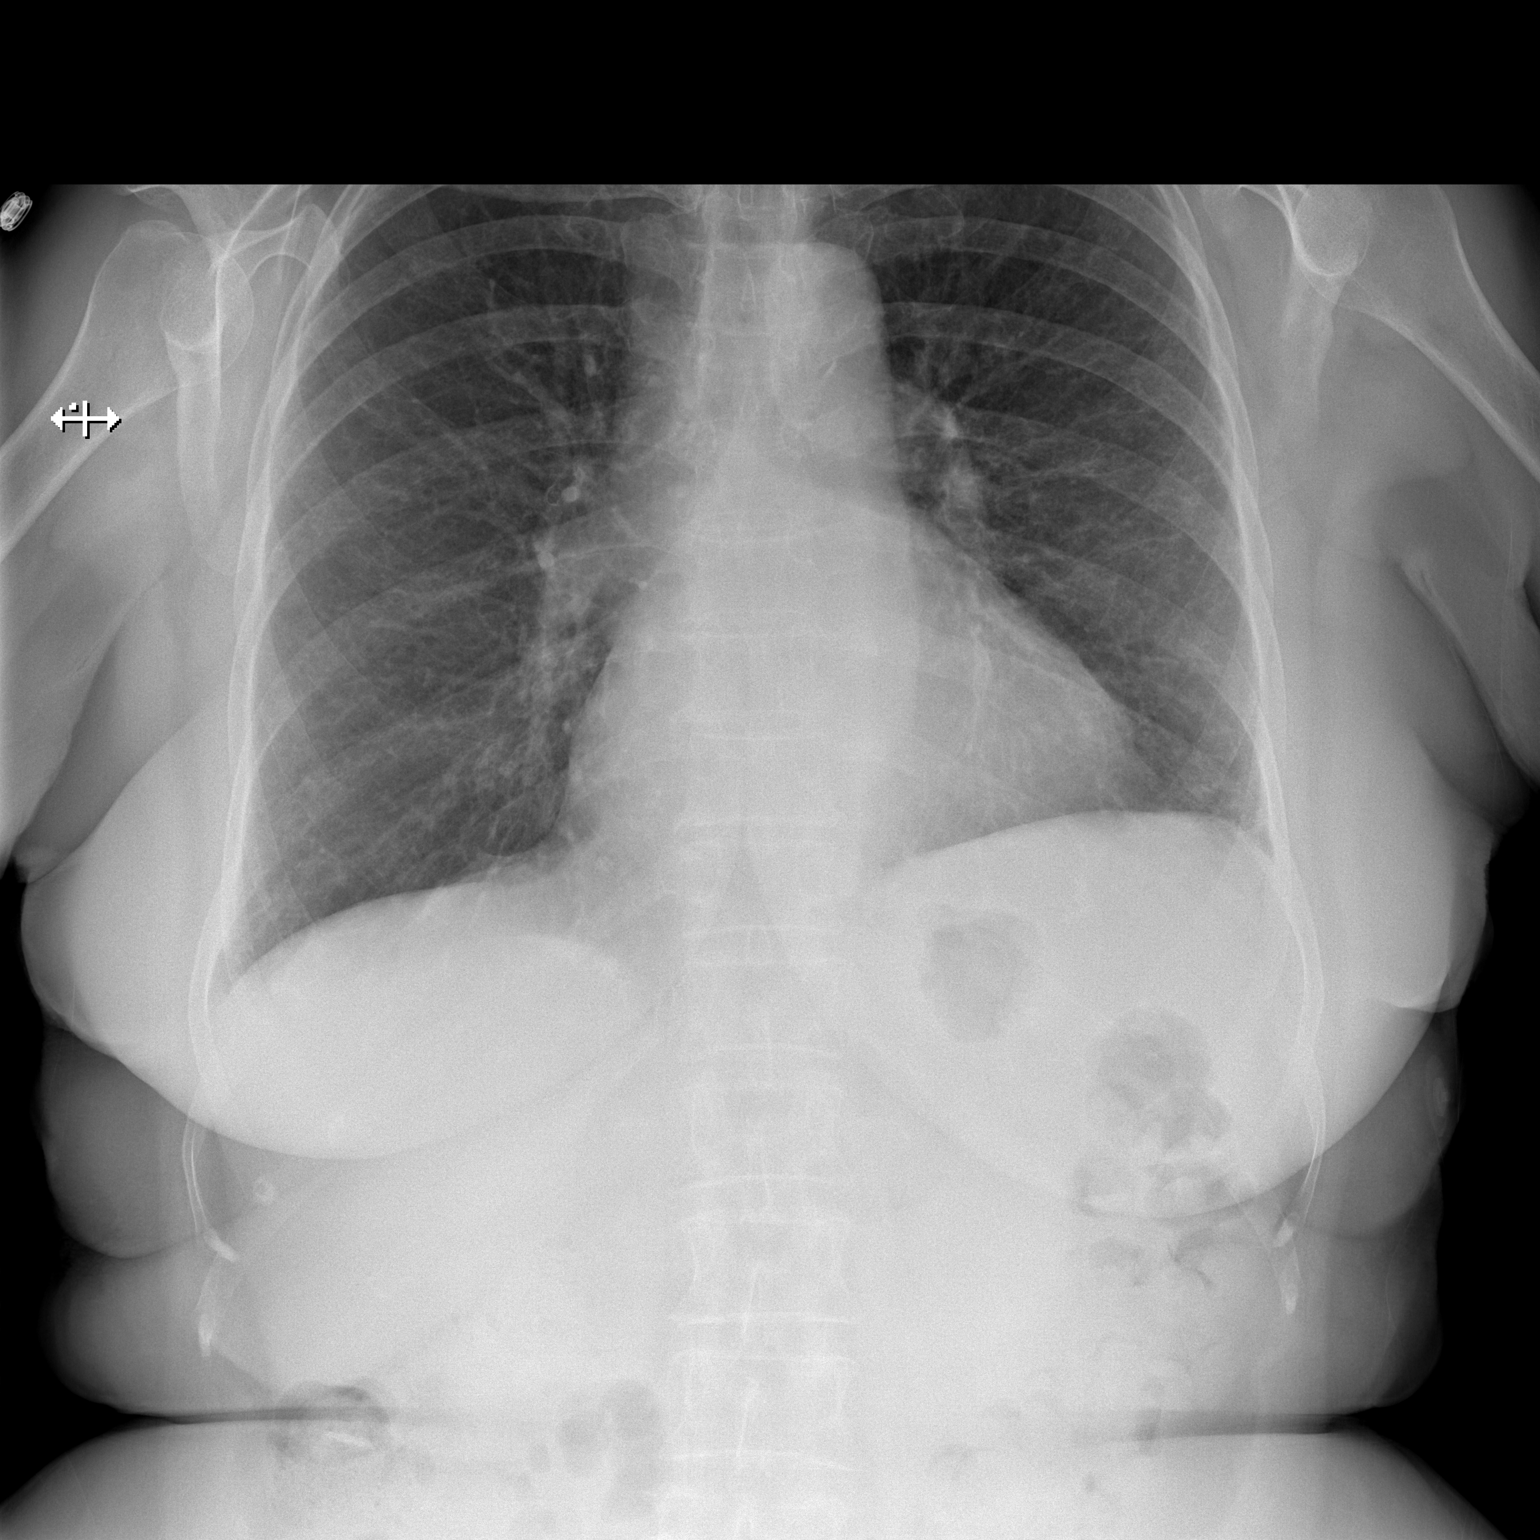

[w chest lat]
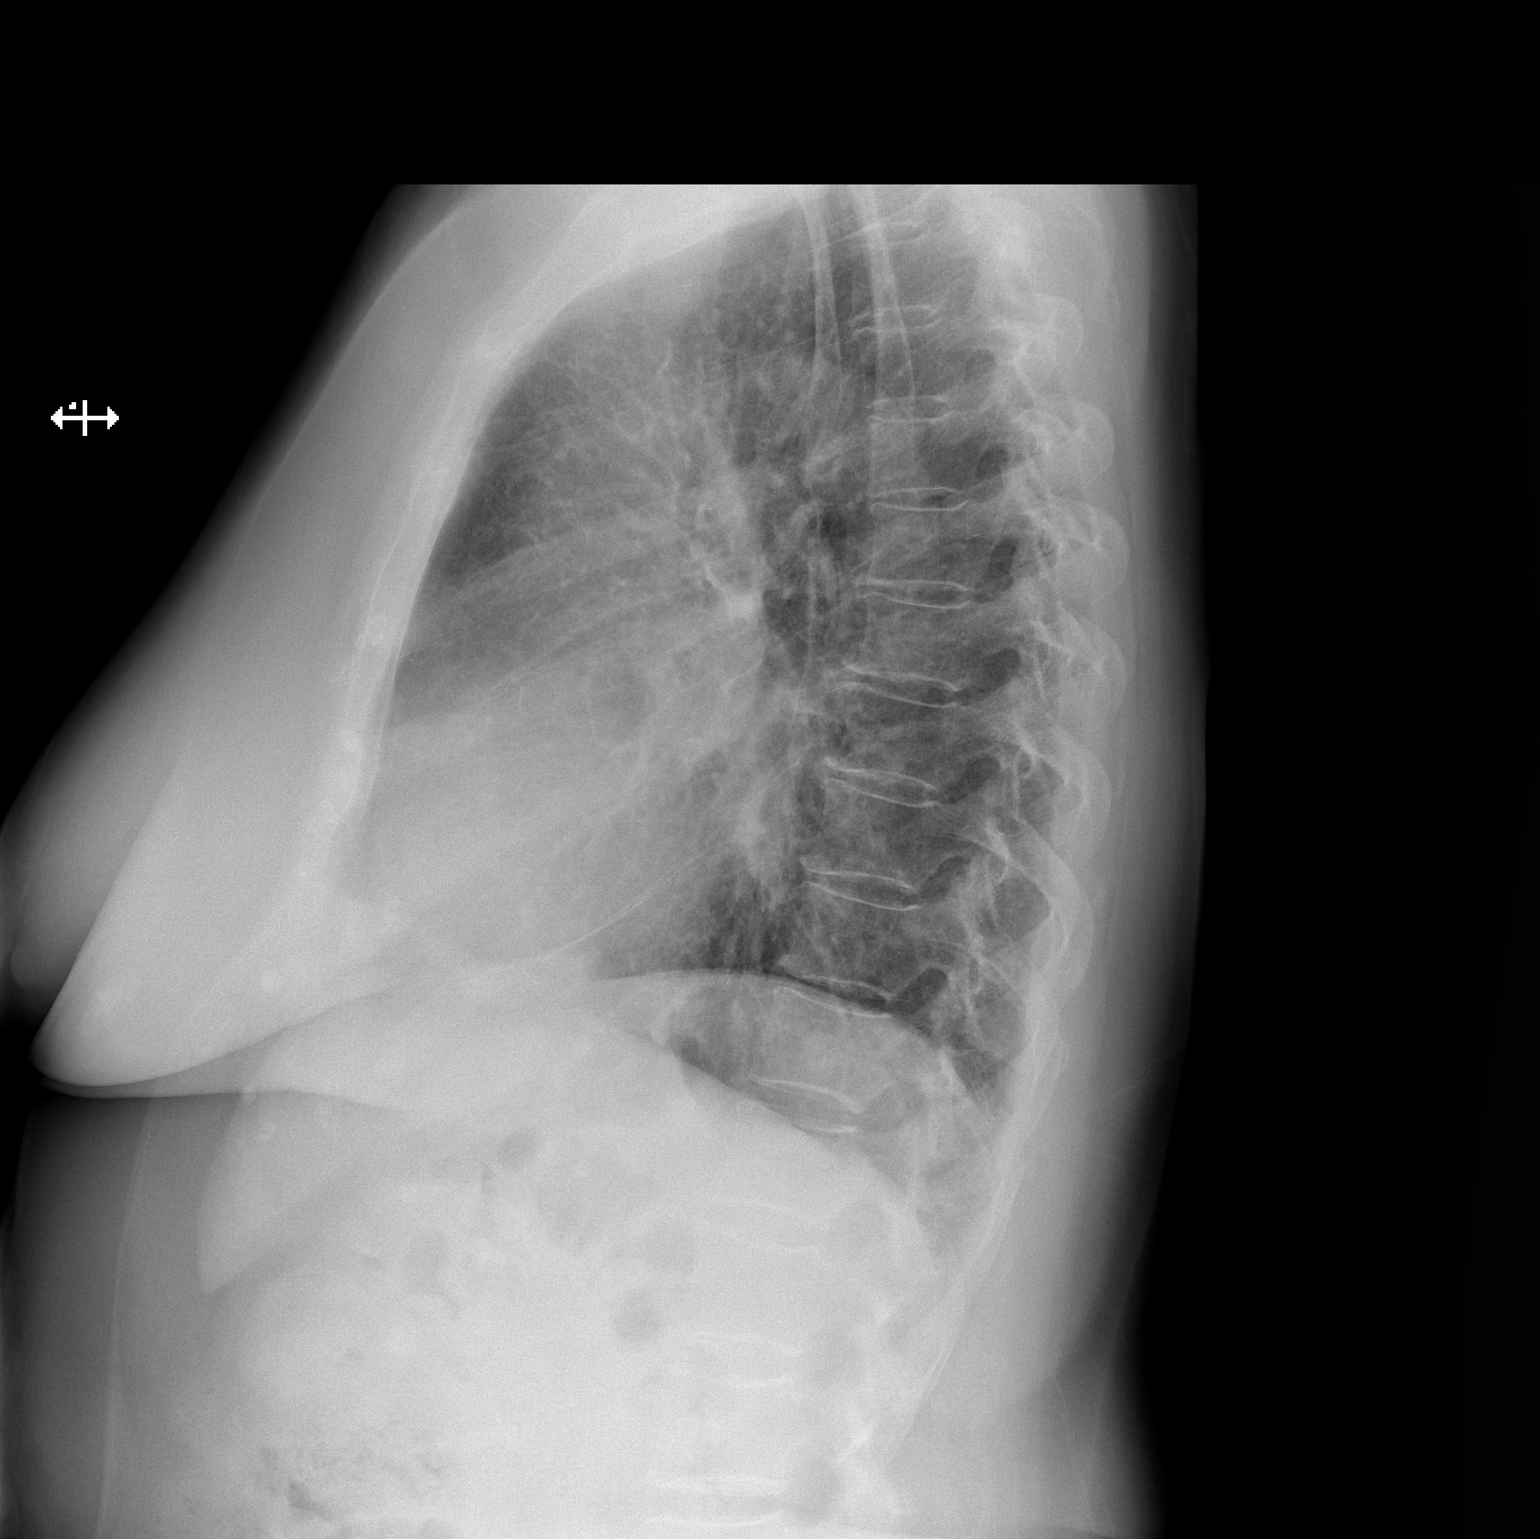

[2 of 2 positions shown; findings below may reference images not displayed]

FINDINGS: The heart size is at upper limits of normal. Minimal unfolding of
the aorta is identified. Both lungs are clear. The visualized
skeletal structures are unremarkable. Minimal left hemidiaphragmatic
elevation is noted.
IMPRESSION: No active cardiopulmonary disease. Per review of the clinical
history, there is a question of aortic dissection and evaluation of
the mediastinum was requested. Although the mediastinal contour is
mildly prominent which may indicate aortic ectasia or unfolding,
this is a nonspecific finding and aortic dissection is not excluded.
If the patient cannot receive IV contrast, modified noncontrast
chest MRI may be helpful for further evaluation for possible
dissection, although chest CT without contrast could be performed
for further evaluation of the aortic diameter or possible calcified
dissection flap if clinically indicated.

## 2016-01-06 ENCOUNTER — Telehealth: Payer: Self-pay | Admitting: Cardiology

## 2016-01-06 ENCOUNTER — Other Ambulatory Visit: Payer: Self-pay | Admitting: *Deleted

## 2016-01-06 MED ORDER — AMLODIPINE BESYLATE 2.5 MG PO TABS: 2.5000 mg | ORAL_TABLET | Freq: Every day | ORAL | 3 refills | Status: DC

## 2016-01-06 NOTE — Telephone Encounter (Signed)
New message  Pt c/o medication issue:  1. Name of Medication: Amlodipine   2. How are you currently taking this medication (dosage and times per day)? 2.5  3. Are you having a reaction (difficulty breathing--STAT)? No   4. What is your medication issue? Pt call requesting to speak RN to inform that she is doing fine taking the dosages that was prescribed to her. And would like the med to be sent in to [facility]. Please call back to discuss if needed

## 2016-01-28 ENCOUNTER — Encounter: Payer: Self-pay | Admitting: Internal Medicine

## 2016-01-28 ENCOUNTER — Ambulatory Visit (INDEPENDENT_AMBULATORY_CARE_PROVIDER_SITE_OTHER): Payer: Medicare Other | Admitting: Internal Medicine

## 2016-01-28 DIAGNOSIS — M7062 Trochanteric bursitis, left hip: Secondary | ICD-10-CM

## 2016-01-28 DIAGNOSIS — M7061 Trochanteric bursitis, right hip: Secondary | ICD-10-CM | POA: Insufficient documentation

## 2016-01-28 MED ORDER — PREDNISONE 20 MG PO TABS: 40.0000 mg | ORAL_TABLET | Freq: Every day | ORAL | 0 refills | Status: DC

## 2016-01-28 NOTE — Patient Instructions (Signed)
We have sent in prednisone. Take 2 pills daily for 5 days.   This should help the hips and the cough possibly.

## 2016-01-28 NOTE — Progress Notes (Signed)
   Subjective:    Patient ID: Vanessa [name], female    DOB: [birth year], 82 y.o.   MRN: [id number]  HPI The patient is an 82 YO female coming in for bilateral hip pain. She does have trochanteric bursitis in both hips. She has seen an orthopedic doctor and they have given her some shots in the hips which helped a little but the pain is severe. She is taking tylenol but it does not help much. She takes 5-6 pills per day. She is not able to take any products containing codeine or narcotics due to her severe allergies to them. She cannot take NSAIDs due to her CKD stage 4. She cannot use voltaren gel due to bleeding GI after using when she tried it before. She has nucynta which she takes from time to time but does not want to use often due to her perceived risk of addiction.   Review of Systems  Constitutional: Positive for activity change. Negative for appetite change, fatigue, fever and unexpected weight change.  HENT: Negative.   Cardiovascular: Negative.   Gastrointestinal: Negative.   Musculoskeletal: Positive for arthralgias, back pain, gait problem and myalgias. Negative for joint swelling, neck pain and neck stiffness.  Skin: Negative.       Objective:   Physical Exam  Constitutional: She appears well-developed and well-nourished.  HENT:  Head: Normocephalic and atraumatic.  Eyes: EOM are normal.  Neck: Normal range of motion.  Cardiovascular: Normal rate and regular rhythm.   Pulmonary/Chest: Effort normal. No respiratory distress. She has no wheezes.  Abdominal: Soft. She exhibits no distension. There is no tenderness. There is no rebound.  Musculoskeletal: She exhibits tenderness.  Pain over the trochanteric bursa.   Skin: Skin is warm and dry.   Vitals:   01/28/16 1306  BP: 134/62  Pulse: 69  Resp: 16  Temp: 97.9 F (36.6 C)  TempSrc: Oral  SpO2: 96%  Weight: 166 lb (75.3 kg)  Height: 5\' 4"  (1.626 m)      Assessment & Plan:

## 2016-01-28 NOTE — Assessment & Plan Note (Signed)
Rx for prednisone bust for 5 days. She cannot take NSAIDs, narcotics, voltaren gel. Tylenol is ineffective. She has exhausted injections in the hips and PT which did not help. This is limiting her QOL.

## 2016-01-28 NOTE — Progress Notes (Signed)
Pre visit review using our clinic review tool, if applicable. No additional management support is needed unless otherwise documented below in the visit note. 

## 2016-02-04 ENCOUNTER — Telehealth: Payer: Self-pay | Admitting: Emergency Medicine

## 2016-02-04 ENCOUNTER — Telehealth: Payer: Self-pay | Admitting: Internal Medicine

## 2016-02-04 NOTE — Telephone Encounter (Signed)
I would recommend to give it a week or so to finish clearing as the prednisone works for a while after stopping.

## 2016-02-04 NOTE — Telephone Encounter (Signed)
Patient aware and will call back in a week if she does not feel better

## 2016-02-04 NOTE — Telephone Encounter (Signed)
Pt called and wants to know if she can get another 5 day supply of predniSONE (DELTASONE) 20 MG tablet. She states it helped and started to clear up but after finishing medication it started back up. Please advise thanks.

## 2016-02-04 NOTE — Telephone Encounter (Signed)
Patient Name: Vanessa [name] DOB: 1927-07-22 Initial Comment caller states she is having pain in hips and lower back Nurse Assessment Guidelines Guideline Title Affirmed Question Affirmed Notes Final Disposition User FINAL ATTEMPT MADE - message left Hensel, RN, Aeriel Comments No voice mail available. Or someone is picking up and then hanging up. Nurse unable to reach pt.

## 2016-02-14 NOTE — Telephone Encounter (Signed)
Pt left msg on triage stating she is still having pain in her hips she would like a refill on the prednisone to be sent to [facility].../lmb

## 2016-02-15 ENCOUNTER — Telehealth: Payer: Self-pay | Admitting: Internal Medicine

## 2016-02-15 NOTE — Telephone Encounter (Signed)
Please see phone note on same topic.

## 2016-02-15 NOTE — Telephone Encounter (Signed)
predniSONE (DELTASONE) 20 MG tablet  Patient is requesting a refill on this medication. She states she is no better and need more of it. Please follow up with patient. Thank you.

## 2016-02-15 NOTE — Telephone Encounter (Signed)
Prednisone should not be refilled for hip pain.

## 2016-02-15 NOTE — Telephone Encounter (Signed)
Notified pt w/MD response. Made appt to see [place] tomorrow pt states she is in a lot of pain...Johny Chess

## 2016-02-16 ENCOUNTER — Encounter: Payer: Self-pay | Admitting: Nurse Practitioner

## 2016-02-16 ENCOUNTER — Ambulatory Visit (INDEPENDENT_AMBULATORY_CARE_PROVIDER_SITE_OTHER): Payer: Medicare Other | Admitting: Nurse Practitioner

## 2016-02-16 VITALS — BP 142/76 | HR 62 | Temp 98.0°F | Ht 64.0 in | Wt 164.0 lb

## 2016-02-16 DIAGNOSIS — G8929 Other chronic pain: Secondary | ICD-10-CM | POA: Diagnosis not present

## 2016-02-16 DIAGNOSIS — M545 Low back pain: Secondary | ICD-10-CM

## 2016-02-16 DIAGNOSIS — M25559 Pain in unspecified hip: Secondary | ICD-10-CM | POA: Diagnosis not present

## 2016-02-16 MED ORDER — GABAPENTIN 100 MG PO CAPS: 100.0000 mg | ORAL_CAPSULE | ORAL | 1 refills | Status: DC | PRN

## 2016-02-16 MED ORDER — PREDNISONE 10 MG (21) PO TBPK: 10.0000 mg | ORAL_TABLET | ORAL | 0 refills | Status: DC

## 2016-02-16 MED ORDER — TIZANIDINE HCL 2 MG PO TABS: 2.0000 mg | ORAL_TABLET | Freq: Three times a day (TID) | ORAL | 0 refills | Status: DC | PRN

## 2016-02-16 NOTE — Progress Notes (Signed)
Pre visit review using our clinic review tool, if applicable. No additional management support is needed unless otherwise documented below in the visit note. 

## 2016-02-16 NOTE — Patient Instructions (Addendum)
Vanessa [name] was informed about possible adverse effects of prolonged use of systemic steroids. She verbalized understanding and will like to take another short course.  Hip Pain Your hip is the joint between your upper legs and your lower pelvis. The bones, cartilage, tendons, and muscles of your hip joint perform a lot of work each day supporting your body weight and allowing you to move around. Hip pain can range from a minor ache to severe pain in one or both of your hips. Pain may be felt on the inside of the hip joint near the groin, or the outside near the buttocks and upper thigh. You may have swelling or stiffness as well. Follow these instructions at home:  Take medicines only as directed by your health care provider.  Apply ice to the injured area:  Put ice in a plastic bag.  Place a towel between your skin and the bag.  Leave the ice on for 15-20 minutes at a time, 3-4 times a day.  Keep your leg raised (elevated) when possible to lessen swelling.  Avoid activities that cause pain.  Follow specific exercises as directed by your health care provider.  Sleep with a pillow between your legs on your most comfortable side.  Record how often you have hip pain, the location of the pain, and what it feels like. Contact a health care provider if:  You are unable to put weight on your leg.  Your hip is red or swollen or very tender to touch.  Your pain or swelling continues or worsens after 1 week.  You have increasing difficulty walking.  You have a fever. Get help right away if:  You have fallen.  You have a sudden increase in pain and swelling in your hip. This information is not intended to replace advice given to you by your health care provider. Make sure you discuss any questions you have with your health care provider. Document Released: 08/24/2009 Document Revised: 08/12/2015 Document Reviewed: 10/31/2012 Elsevier Interactive Patient Education  2017 [employer].

## 2016-02-16 NOTE — Progress Notes (Signed)
Subjective:  Patient ID: Vanessa [name], female    DOB: 1927-04-08  Age: 82 y.o. MRN: [id number]  CC: Hip Pain (lower back/hip pain for 1 wk,she had does water arobic.)   Hip Pain   The incident occurred more than 1 week ago. There was no injury mechanism. The pain is present in the right hip and left hip. The quality of the pain is described as aching. The pain is severe. The pain has been fluctuating since onset. Pertinent negatives include no inability to bear weight, loss of motion, loss of sensation, muscle weakness, numbness or tingling. Exacerbated by: prolonged sitting or standing. She has tried ice, heat, rest and acetaminophen (oral prednisone, lidocaine patches) for the symptoms. Improvement on treatment: had significant relief while taking oral prednisone 2weeks ago.  She was seen for similar symptoms 2weeks ago by pcp, orap presdnisone was prescribed, had complete relief while taking prednisone. She admits to hx of recurrent lower back and bilateral hip pain for several years. She was treated by orthopedic in past with intraarticular injections (depomedrol). Last visit with orthopedist and joint injection was 42months ago. She was informed by orthopedist that she will no longer be able to get joint injections. She is unable to use and NSAIDs due to CKD and GI bleed. She also does not want to use any narcotic due to hx of intolerance and possible addiction.  Outpatient Medications Prior to Visit  Medication Sig Dispense Refill  . amLODipine (NORVASC) 2.5 MG tablet Take 1 tablet (2.5 mg total) by mouth daily. 90 tablet 3  . aspirin 81 MG tablet Take 81 mg by mouth daily.     . Cholecalciferol (VITAMIN D3) 1000 units CAPS Take by mouth.    . dicyclomine (BENTYL) 10 MG capsule Take 1 capsule (10 mg total) by mouth daily as needed for spasms. 90 capsule 3  . estradiol (ESTRACE) 0.5 MG tablet Take 1 tablet (0.5 mg total) by mouth every Monday, Wednesday, and Friday. 45 tablet 3  .  nitroGLYCERIN (NITROSTAT) 0.4 MG SL tablet Place 1 tablet (0.4 mg total) under the tongue every 5 (five) minutes x 3 doses as needed for chest pain. 25 tablet 2  . predniSONE (DELTASONE) 20 MG tablet Take 2 tablets (40 mg total) by mouth daily with breakfast. 10 tablet 0  . Magnesium 400 MG CAPS Take by mouth.     No facility-administered medications prior to visit.     ROS See HPI  Objective:  BP (!) 142/76   Pulse 62   Temp 98 F (36.7 C)   Ht 5\' 4"  (1.626 m)   Wt 164 lb (74.4 kg)   SpO2 95%   BMI 28.15 kg/m   BP Readings from Last 3 Encounters:  02/16/16 (!) 142/76  01/28/16 134/62  12/09/15 122/80    Wt Readings from Last 3 Encounters:  02/16/16 164 lb (74.4 kg)  01/28/16 166 lb (75.3 kg)  12/09/15 165 lb (74.8 kg)    Physical Exam  Constitutional: She is oriented to person, place, and time.  Cardiovascular: Normal rate.   Pulmonary/Chest: Effort normal.  Musculoskeletal: She exhibits tenderness. She exhibits no edema.       Right hip: She exhibits tenderness. She exhibits normal range of motion and normal strength.       Left hip: She exhibits tenderness. She exhibits normal range of motion and normal strength.       Lumbar back: She exhibits tenderness. She exhibits normal range of motion and no  bony tenderness.  Neurological: She is alert and oriented to person, place, and time.  Skin: Skin is warm and dry. No erythema.    Lab Results  Component Value Date   WBC 9.3 04/14/2014   HGB 11.2 (L) 04/14/2014   HCT 34.0 (L) 04/14/2014   PLT 207 04/14/2014   GLUCOSE 113 (H) 04/14/2014   CHOL 191 04/12/2014   TRIG 100 04/12/2014   HDL 40 04/12/2014   LDLCALC 131 (H) 04/12/2014   ALT 10 04/14/2014   AST 17 04/14/2014   NA 141 04/14/2014   K 4.1 04/14/2014   CL 104 04/14/2014   CREATININE 1.87 (H) 04/14/2014   BUN 28 (H) 04/14/2014   CO2 28 04/14/2014   TSH 6.57 (H) 08/09/2015   INR 0.9 04/03/2007   HGBA1C  04/04/2007    5.8 (NOTE)   The ADA recommends  the following therapeutic goals for glycemic   control related to Hgb A1C measurement:   Goal of Therapy:   < 7.0% Hgb A1C   Action Suggested:  > 8.0% Hgb A1C   Ref:  Diabetes Care, 22, Suppl. 1, 1999    No results found.  Assessment & Plan:   Trasha was seen today for hip pain.  Diagnoses and all orders for this visit:  Chronic hip pain, unspecified laterality -     predniSONE (STERAPRED UNI-PAK 21 TAB) 10 MG (21) TBPK tablet; Take 1 tablet (10 mg total) by mouth as directed. -     gabapentin (NEURONTIN) 100 MG capsule; Take 1 capsule (100 mg total) by mouth as needed. Take 1cap at bedtime x 3days, then increase to 1cap twice a day x 3days, then increase to 1cap three times a day continuously. -     tiZANidine (ZANAFLEX) 2 MG tablet; Take 1 tablet (2 mg total) by mouth every 8 (eight) hours as needed for muscle spasms.  Chronic bilateral low back pain without sciatica -     predniSONE (STERAPRED UNI-PAK 21 TAB) 10 MG (21) TBPK tablet; Take 1 tablet (10 mg total) by mouth as directed. -     gabapentin (NEURONTIN) 100 MG capsule; Take 1 capsule (100 mg total) by mouth as needed. Take 1cap at bedtime x 3days, then increase to 1cap twice a day x 3days, then increase to 1cap three times a day continuously. -     tiZANidine (ZANAFLEX) 2 MG tablet; Take 1 tablet (2 mg total) by mouth every 8 (eight) hours as needed for muscle spasms.   I have discontinued Ms. Runco's predniSONE. I am also having her start on predniSONE, gabapentin, and tiZANidine. Additionally, I am having her maintain her aspirin, nitroGLYCERIN, estradiol, dicyclomine, amLODipine, Vitamin D3, and Magnesium.  Meds ordered this encounter  Medications  . Magnesium 100 MG CAPS    Sig: Take 1 capsule by mouth daily.  . predniSONE (STERAPRED UNI-PAK 21 TAB) 10 MG (21) TBPK tablet    Sig: Take 1 tablet (10 mg total) by mouth as directed.    Dispense:  21 tablet    Refill:  0    Order Specific Question:   Supervising Provider     Answer:   Cassandria Anger [[id number]]  . gabapentin (NEURONTIN) 100 MG capsule    Sig: Take 1 capsule (100 mg total) by mouth as needed. Take 1cap at bedtime x 3days, then increase to 1cap twice a day x 3days, then increase to 1cap three times a day continuously.    Dispense:  90 capsule  Refill:  1    Order Specific Question:   Supervising Provider    Answer:   Cassandria Anger [[id number]]  . tiZANidine (ZANAFLEX) 2 MG tablet    Sig: Take 1 tablet (2 mg total) by mouth every 8 (eight) hours as needed for muscle spasms.    Dispense:  21 tablet    Refill:  0    Order Specific Question:   Supervising Provider    Answer:   Cassandria Anger [[id number]]    Follow-up: Return in about 1 month (around 03/17/2016) for hip pain.  Wilfred Lacy, NP

## 2016-02-18 ENCOUNTER — Telehealth: Payer: Self-pay | Admitting: Internal Medicine

## 2016-02-18 NOTE — Telephone Encounter (Signed)
Routing to [place], please advise, thanks 

## 2016-02-18 NOTE — Telephone Encounter (Signed)
Advised patient of charlottes note---patient states she will stop medication and call back on Monday if no better

## 2016-02-18 NOTE — Telephone Encounter (Signed)
Patient states that she has experienced adverse symptoms with the gabapentin (NEURONTIN) 100 MG capsule [id number]  . Nausea- dizziness- gait issues.  She has d/c taking the medications. Please advise patient.

## 2016-02-18 NOTE — Telephone Encounter (Signed)
She should no longer use medication. Use only prednisone and zanaflex as prescribed. Call office if no improvement of nausea or dizziness in 48hrs.

## 2016-02-22 DIAGNOSIS — D631 Anemia in chronic kidney disease: Secondary | ICD-10-CM | POA: Diagnosis not present

## 2016-02-22 DIAGNOSIS — N179 Acute kidney failure, unspecified: Secondary | ICD-10-CM | POA: Diagnosis not present

## 2016-02-22 DIAGNOSIS — N184 Chronic kidney disease, stage 4 (severe): Secondary | ICD-10-CM | POA: Diagnosis not present

## 2016-02-22 DIAGNOSIS — I129 Hypertensive chronic kidney disease with stage 1 through stage 4 chronic kidney disease, or unspecified chronic kidney disease: Secondary | ICD-10-CM | POA: Diagnosis not present

## 2016-02-22 DIAGNOSIS — N2581 Secondary hyperparathyroidism of renal origin: Secondary | ICD-10-CM | POA: Diagnosis not present

## 2016-02-22 NOTE — Telephone Encounter (Signed)
Left message for patient to call back to see how she is feeling.  She needed to give the medication a week to work. If not better then call back. Its been a week now.

## 2016-03-08 DIAGNOSIS — J84113 Idiopathic non-specific interstitial pneumonitis: Secondary | ICD-10-CM | POA: Diagnosis not present

## 2016-03-08 DIAGNOSIS — R05 Cough: Secondary | ICD-10-CM | POA: Diagnosis not present

## 2016-03-15 ENCOUNTER — Encounter: Payer: Self-pay | Admitting: Internal Medicine

## 2016-03-15 ENCOUNTER — Ambulatory Visit (INDEPENDENT_AMBULATORY_CARE_PROVIDER_SITE_OTHER): Payer: Medicare Other | Admitting: Internal Medicine

## 2016-03-15 DIAGNOSIS — A3790 Whooping cough, unspecified species without pneumonia: Secondary | ICD-10-CM | POA: Diagnosis not present

## 2016-03-15 NOTE — Assessment & Plan Note (Signed)
States she was diagnosed with this although review of the labs taken show high IgG and no IgM for pertussis. Generally the standard for diagnosis is with change in titer 1 month apart. She did not complete treatment due to perceived side effect of medication. She is feeling improved and lungs are clear on exam today. She did not have classic vomiting with cough during course. We talked about usual recovery with cough taking 3-4 additional weeks.

## 2016-03-15 NOTE — Progress Notes (Signed)
   Subjective:    Patient ID: Vanessa [name], female    DOB: Aug 17, 1927, 82 y.o.   MRN: [id number]  HPI The patient is an 82 YO female coming in for follow up of her cough. She was seen at urgent care and told that she has pertussis. She was given some steroids and antibiotics at the urgent care and then given a prescription for steroids, cough medicine, and levaquin. She took a few doses of the levaquin and then stopped as she felt it was giving her some tendon pain in the legs. She is feeling better now though. She is still having some cough. No fevers or chills. Less coughing fits than before.   Review of Systems  Constitutional: Positive for activity change. Negative for appetite change, chills, fatigue, fever and unexpected weight change.  HENT: Negative for congestion, postnasal drip, rhinorrhea, sinus pain, sinus pressure, sore throat and trouble swallowing.   Eyes: Negative.   Respiratory: Positive for cough. Negative for chest tightness, shortness of breath and wheezing.   Cardiovascular: Negative.   Gastrointestinal: Negative.   Musculoskeletal: Negative.   Skin: Negative.       Objective:   Physical Exam  Constitutional: She is oriented to person, place, and time. She appears well-developed and well-nourished.  HENT:  Head: Normocephalic and atraumatic.  Oropharynx with some redness and mild clear drainage.   Eyes: EOM are normal.  Neck: Normal range of motion.  Cardiovascular: Normal rate and regular rhythm.   Pulmonary/Chest: Effort normal and breath sounds normal. No respiratory distress. She has no wheezes. She has no rales.  Abdominal: Soft.  Neurological: She is alert and oriented to person, place, and time.  Skin: Skin is warm and dry.   Vitals:   03/15/16 1035  BP: 122/70  Pulse: 67  Resp: 12  Temp: 97.8 F (36.6 C)  TempSrc: Oral  SpO2: 97%  Weight: 162 lb (73.5 kg)  Height: 5\' 4"  (1.626 m)      Assessment & Plan:

## 2016-03-15 NOTE — Patient Instructions (Signed)
We do not need blood work today.   We will see you back for the physical.

## 2016-03-15 NOTE — Progress Notes (Signed)
Pre visit review using our clinic review tool, if applicable. No additional management support is needed unless otherwise documented below in the visit note. 

## 2016-03-31 ENCOUNTER — Ambulatory Visit (INDEPENDENT_AMBULATORY_CARE_PROVIDER_SITE_OTHER): Payer: Medicare Other | Admitting: Internal Medicine

## 2016-03-31 ENCOUNTER — Encounter: Payer: Self-pay | Admitting: Internal Medicine

## 2016-03-31 DIAGNOSIS — M25562 Pain in left knee: Secondary | ICD-10-CM | POA: Diagnosis not present

## 2016-03-31 DIAGNOSIS — M25561 Pain in right knee: Secondary | ICD-10-CM

## 2016-03-31 DIAGNOSIS — M25569 Pain in unspecified knee: Secondary | ICD-10-CM | POA: Insufficient documentation

## 2016-03-31 NOTE — Assessment & Plan Note (Signed)
She is strongly advised not to take NSAIDs with her CKD stage 4. She is advised to take tizanidine which she already has at home but has not taken. She also has some otc lidocaine patches which she will try.

## 2016-03-31 NOTE — Patient Instructions (Addendum)
You can use the tizanidine (muscle relaxer) for the pain in the legs which you have at home.   You can use the lidocaine patches on the legs as well.   Call us back if you do not feel better.

## 2016-03-31 NOTE — Progress Notes (Signed)
   Subjective:    Patient ID: Vanessa [name], female    DOB: 02/19/1928, 83 y.o.   MRN: [id number]  HPI The patient is an 83 YO female coming in for leg pain. She started having this pain while taking levaquin for possible pertussis. She read about tendon pain as side effect. She was having improved pain when she saw [place] last week but it has since gotten worse and she is concerned about rupturing a tendon when doing activity. She has been taking NSAIDs which she knows she should not take. She denies fall or overuse. She has not tried tizanidine as advised last visit.   Review of Systems  Constitutional: Positive for activity change. Negative for appetite change, fatigue, fever and unexpected weight change.  Respiratory: Negative.   Cardiovascular: Negative.   Gastrointestinal: Negative.   Musculoskeletal: Positive for arthralgias and myalgias. Negative for back pain, gait problem, joint swelling, neck pain and neck stiffness.  Skin: Negative.   Neurological: Negative.       Objective:   Physical Exam  Constitutional: She is oriented to person, place, and time. She appears well-developed and well-nourished.  HENT:  Head: Normocephalic and atraumatic.  Eyes: EOM are normal.  Cardiovascular: Normal rate and regular rhythm.   Pulmonary/Chest: Effort normal and breath sounds normal. No respiratory distress. She has no wheezes. She has no rales.  Abdominal: Soft.  Musculoskeletal: She exhibits tenderness.  Tenderness on the lateral aspect of shins both legs. No pain or instability over the achilles tendon.   Neurological: She is alert and oriented to person, place, and time. Coordination normal.  Skin: Skin is warm and dry.   Vitals:   03/31/16 1617  BP: 140/80  Pulse: 67  Resp: 16  Temp: 97.8 F (36.6 C)  TempSrc: Oral  SpO2: 97%  Weight: 161 lb (73 kg)  Height: 5\' 4"  (1.626 m)      Assessment & Plan:

## 2016-03-31 NOTE — Progress Notes (Signed)
Pre visit review using our clinic review tool, if applicable. No additional management support is needed unless otherwise documented below in the visit note. 

## 2016-04-26 ENCOUNTER — Other Ambulatory Visit: Payer: Self-pay | Admitting: Internal Medicine

## 2016-07-06 ENCOUNTER — Encounter: Payer: Self-pay | Admitting: Internal Medicine

## 2016-07-06 ENCOUNTER — Ambulatory Visit (INDEPENDENT_AMBULATORY_CARE_PROVIDER_SITE_OTHER)
Admission: RE | Admit: 2016-07-06 | Discharge: 2016-07-06 | Disposition: A | Discharge status: Home / Self Care | Payer: Medicare Other | Source: Ambulatory Visit | Attending: Internal Medicine | Admitting: Internal Medicine

## 2016-07-06 ENCOUNTER — Ambulatory Visit (INDEPENDENT_AMBULATORY_CARE_PROVIDER_SITE_OTHER): Payer: Medicare Other | Admitting: Internal Medicine

## 2016-07-06 VITALS — BP 138/72 | HR 65 | Temp 97.8°F | Resp 14 | Ht 64.0 in | Wt 165.0 lb

## 2016-07-06 DIAGNOSIS — M7061 Trochanteric bursitis, right hip: Secondary | ICD-10-CM | POA: Diagnosis not present

## 2016-07-06 DIAGNOSIS — M545 Low back pain: Secondary | ICD-10-CM | POA: Diagnosis not present

## 2016-07-06 DIAGNOSIS — M7062 Trochanteric bursitis, left hip: Secondary | ICD-10-CM | POA: Diagnosis not present

## 2016-07-06 DIAGNOSIS — G8929 Other chronic pain: Secondary | ICD-10-CM | POA: Diagnosis not present

## 2016-07-06 MED ORDER — METHYLPREDNISOLONE ACETATE 40 MG/ML IJ SUSP
40.0000 mg | Freq: Once | INTRAMUSCULAR | Status: AC
  Administered 2016-07-06: 40 mg via INTRAMUSCULAR

## 2016-07-06 NOTE — Assessment & Plan Note (Signed)
Given depo-medrol 40 mg IM at visit. Reminded again to try the tizanidine to see if this helps. Given stretching exercises. Reminded not to take any NSAIDs due to her CKD.

## 2016-07-06 NOTE — Patient Instructions (Signed)
We have given you the steroid shot today and are checking the x-ray of the low back. We will call you back with the results.    Trochanteric Bursitis Rehab Ask your health care provider which exercises are safe for you. Do exercises exactly as told by your health care provider and adjust them as directed. It is normal to feel mild stretching, pulling, tightness, or discomfort as you do these exercises, but you should stop right away if you feel sudden pain or your pain gets worse.Do not begin these exercises until told by your health care provider. Stretching exercises These exercises warm up your muscles and joints and improve the movement and flexibility of your hip. These exercises also help to relieve pain and stiffness. Exercise A: Iliotibial band stretch   1. Lie on your side with your left / right leg in the top position. 2. Bend your left / right knee and grab your ankle. 3. Slowly bring your knee back so your thigh is behind your body. 4. Slowly lower your knee toward the floor until you feel a gentle stretch on the outside of your left / right thigh. If you do not feel a stretch and your knee will not fall farther, place the heel of your other foot on top of your outer knee and pull your thigh down farther. 5. Hold this position for __________ seconds. 6. Slowly return to the starting position. Repeat __________ times. Complete this exercise __________ times a day. Strengthening exercises These exercises build strength and endurance in your hip and pelvis. Endurance is the ability to use your muscles for a long time, even after they get tired. Exercise B: Bridge (  hip extensors) 1. Lie on your back on a firm surface with your knees bent and your feet flat on the floor. 2. Tighten your buttocks muscles and lift your buttocks off the floor until your trunk is level with your thighs. You should feel the muscles working in your buttocks and the back of your thighs. If this exercise is  too easy, try doing it with your arms crossed over your chest. 3. Hold this position for __________ seconds. 4. Slowly return to the starting position. 5. Let your muscles relax completely between repetitions. Repeat __________ times. Complete this exercise __________ times a day. Exercise C: Squats ( knee extensors and  quadriceps) 1. Stand in front of a table, with your feet and knees pointing straight ahead. You may rest your hands on the table for balance but not for support. 2. Slowly bend your knees and lower your hips like you are going to sit in a chair.  Keep your weight over your heels, not over your toes.  Keep your lower legs upright so they are parallel with the table legs.  Do not let your hips go lower than your knees.  Do not bend lower than told by your health care provider.  If your hip pain increases, do not bend as low. 3. Hold this position for __________ seconds. 4. Slowly push with your legs to return to standing. Do not use your hands to pull yourself to standing. Repeat __________ times. Complete this exercise __________ times a day. Exercise D: Hip hike 1. Stand sideways on a bottom step. Stand on your left / right leg with your other foot unsupported next to the step. You can hold onto the railing or wall if needed for balance. 2. Keeping your knees straight and your torso square, lift your left / right  hip up toward the ceiling. 3. Hold this position for __________ seconds. 4. Slowly let your left / right hip lower toward the floor, past the starting position. Your foot should get closer to the floor. Do not lean or bend your knees. Repeat __________ times. Complete this exercise __________ times a day. Exercise E: Single leg stand 1. Stand near a counter or door frame that you can hold onto for balance as needed. It is helpful to stand in front of a mirror for this exercise so you can watch your hip. 2. Squeeze your left / right buttock muscles then lift up  your other foot. Do not let your left / right hip push out to the side. 3. Hold this position for __________ seconds. Repeat __________ times. Complete this exercise __________ times a day. This information is not intended to replace advice given to you by your health care provider. Make sure you discuss any questions you have with your health care provider. Document Released: 04/13/2004 Document Revised: 11/11/2015 Document Reviewed: 02/19/2015 Elsevier Interactive Patient Education  2017 [employer].

## 2016-07-06 NOTE — Progress Notes (Signed)
   Subjective:    Patient ID: Vanessa [name], female    DOB: [birth year], 83 y.o.   MRN: [id number]  HPI The patient is an 83 YO female coming in for hip and lower back pain. She has had this before and has dealt with trochanteric bursitis in her hips off and on. She is not allowed to take NSAIDs due to her CKD stage 4. She is taking tylenol and using ice pads which are helping some. Most of the pain is in the low back and on the side of the hips. She denies injury to the area. Started this time in the last 2-3 months and has been steady since that time.   Review of Systems  Constitutional: Negative.   Respiratory: Negative.   Cardiovascular: Negative.   Gastrointestinal: Negative.   Musculoskeletal: Positive for arthralgias, back pain, gait problem and myalgias. Negative for joint swelling, neck pain and neck stiffness.  Skin: Negative.   Neurological: Negative for dizziness, tremors, weakness, light-headedness and numbness.      Objective:   Physical Exam  Constitutional: She is oriented to person, place, and time. She appears well-developed and well-nourished.  HENT:  Head: Normocephalic and atraumatic.  Eyes: EOM are normal.  Cardiovascular: Normal rate and regular rhythm.   Pulmonary/Chest: Effort normal. No respiratory distress. She has no wheezes. She has no rales.  Abdominal: Soft.  Musculoskeletal: She exhibits tenderness.  Pain in the low lumbar spinal and paraspinally as well as both trochanteric bursas  Neurological: She is alert and oriented to person, place, and time.  Slow to rise and hesitating gait  Skin: Skin is warm and dry.   Vitals:   07/06/16 1021  BP: 138/72  Pulse: 65  Resp: 14  Temp: 97.8 F (36.6 C)  TempSrc: Oral  SpO2: 96%  Weight: 165 lb (74.8 kg)  Height: 5\' 4"  (1.626 m)      Assessment & Plan:  Depo-medrol 40 mg IM given at visit.

## 2016-07-06 NOTE — Assessment & Plan Note (Signed)
Checking x-ray lumbar to look for any compression fractures given age and CKD stage 4. If present will treat as appropriate.

## 2016-07-06 NOTE — Progress Notes (Signed)
Pre visit review using our clinic review tool, if applicable. No additional management support is needed unless otherwise documented below in the visit note. 

## 2016-07-27 ENCOUNTER — Other Ambulatory Visit: Payer: Self-pay | Admitting: Internal Medicine

## 2016-08-09 ENCOUNTER — Other Ambulatory Visit (INDEPENDENT_AMBULATORY_CARE_PROVIDER_SITE_OTHER): Payer: Medicare Other

## 2016-08-09 ENCOUNTER — Encounter: Payer: Self-pay | Admitting: Internal Medicine

## 2016-08-09 ENCOUNTER — Ambulatory Visit (INDEPENDENT_AMBULATORY_CARE_PROVIDER_SITE_OTHER): Payer: Medicare Other | Admitting: Internal Medicine

## 2016-08-09 VITALS — BP 146/80 | HR 63 | Temp 97.8°F | Resp 12 | Ht 64.0 in | Wt 163.0 lb

## 2016-08-09 DIAGNOSIS — R1011 Right upper quadrant pain: Secondary | ICD-10-CM

## 2016-08-09 LAB — COMPREHENSIVE METABOLIC PANEL
ALK PHOS: 48 U/L (ref 39–117)
ALT: 11 U/L (ref 0–35)
AST: 15 U/L (ref 0–37)
Albumin: 4.1 g/dL (ref 3.5–5.2)
BILIRUBIN TOTAL: 0.4 mg/dL (ref 0.2–1.2)
BUN: 17 mg/dL (ref 6–23)
CO2: 28 mEq/L (ref 19–32)
CREATININE: 1.45 mg/dL — AB (ref 0.40–1.20)
Calcium: 10.3 mg/dL (ref 8.4–10.5)
Chloride: 102 mEq/L (ref 96–112)
GFR: 36.36 mL/min — ABNORMAL LOW (ref >60.00)
GLUCOSE: 103 mg/dL — AB (ref 70–99)
Potassium: 4.4 mEq/L (ref 3.5–5.1)
SODIUM: 141 meq/L (ref 135–145)
TOTAL PROTEIN: 6.9 g/dL (ref 6.0–8.3)

## 2016-08-09 LAB — CBC
HCT: 43.6 % (ref 36.0–46.0)
Hemoglobin: 14.7 g/dL (ref 12.0–15.0)
MCHC: 33.7 g/dL (ref 30.0–36.0)
MCV: 99 fl (ref 78.0–100.0)
Platelets: 264 10*3/uL (ref 150.0–400.0)
RBC: 4.41 Mil/uL (ref 3.87–5.11)
RDW: 12.8 % (ref 11.5–15.5)
WBC: 9.6 10*3/uL (ref 4.0–10.5)

## 2016-08-09 LAB — LIPID PANEL
Cholesterol: 242 mg/dL — ABNORMAL HIGH (ref 0–200)
HDL: 47.5 mg/dL (ref >39.00)
LDL Cholesterol: 167 mg/dL — ABNORMAL HIGH (ref 0–99)
NONHDL: 194.9
Total CHOL/HDL Ratio: 5
Triglycerides: 139 mg/dL (ref 0.0–149.0)
VLDL: 27.8 mg/dL (ref 0.0–40.0)

## 2016-08-09 LAB — T4, FREE: FREE T4: 0.69 ng/dL (ref 0.60–1.60)

## 2016-08-09 LAB — TSH: TSH: 5.54 u[IU]/mL — AB (ref 0.35–4.50)

## 2016-08-09 LAB — VITAMIN D 25 HYDROXY (VIT D DEFICIENCY, FRACTURES): VITD: 38.81 ng/mL (ref 30.00–100.00)

## 2016-08-09 NOTE — Progress Notes (Signed)
   Subjective:    Patient ID: Vanessa [name], female    DOB: 03-Apr-1927, 83 y.o.   MRN: [id number]  HPI The patient is an 83 YO female coming in for new RUQ pain. This is intermittent and generally lasts about 15 minutes when present. Generally after eating. Denies certain food triggers. She has never tried anything for it while it is going on. Denies heartburn or reflux while it is happening. Does not radiate. She does not vomit when the pain starts. Rated 4-5/10 then gone.   Review of Systems  Constitutional: Negative.   Respiratory: Negative for cough, chest tightness and shortness of breath.   Cardiovascular: Negative for chest pain, palpitations and leg swelling.  Gastrointestinal: Positive for abdominal pain and nausea. Negative for abdominal distention, blood in stool, constipation, diarrhea and vomiting.  Musculoskeletal: Negative.   Skin: Negative.   Neurological: Negative.       Objective:   Physical Exam  Constitutional: She is oriented to person, place, and time. She appears well-developed and well-nourished.  HENT:  Head: Normocephalic and atraumatic.  Eyes: EOM are normal.  Neck: Normal range of motion.  Cardiovascular: Normal rate and regular rhythm.   Pulmonary/Chest: Effort normal and breath sounds normal. No respiratory distress. She has no wheezes. She has no rales.  Abdominal: Soft. Bowel sounds are normal. She exhibits no distension and no mass. There is no tenderness. There is no rebound and no guarding.  Neurological: She is alert and oriented to person, place, and time. Coordination normal.  Skin: Skin is warm and dry.  Psychiatric: She has a normal mood and affect.   Vitals:   08/09/16 1021  BP: (!) 146/80  Pulse: 63  Resp: 12  Temp: 97.8 F (36.6 C)  TempSrc: Oral  SpO2: 93%  Weight: 163 lb (73.9 kg)  Height: 5\' 4"  (1.626 m)      Assessment & Plan:

## 2016-08-09 NOTE — Patient Instructions (Signed)
We are checking the labs today and will get the ultrasound of the stomach.

## 2016-08-11 DIAGNOSIS — R1011 Right upper quadrant pain: Secondary | ICD-10-CM | POA: Insufficient documentation

## 2016-08-13 NOTE — Assessment & Plan Note (Signed)
Ordered RUQ abdominal US to rule out gallstones. Checking CBC, CMP for other metabolic causes. Does not wish to try any pain medication due to limited nature of symptom duration.

## 2016-08-23 ENCOUNTER — Ambulatory Visit
Admission: RE | Admit: 2016-08-23 | Discharge: 2016-08-23 | Disposition: A | Discharge status: Home / Self Care | Payer: Medicare Other | Source: Ambulatory Visit | Attending: Internal Medicine | Admitting: Internal Medicine

## 2016-08-23 DIAGNOSIS — R1011 Right upper quadrant pain: Secondary | ICD-10-CM

## 2016-08-24 ENCOUNTER — Telehealth: Payer: Self-pay | Admitting: Internal Medicine

## 2016-08-24 NOTE — Telephone Encounter (Signed)
The Pt would like a copy of her ultrasound from yesterday mailed to her

## 2016-08-31 NOTE — Telephone Encounter (Signed)
They were put in mail basket the day after she requested

## 2016-08-31 NOTE — Telephone Encounter (Signed)
Pt called back checking on the copy of her ultrasound results.

## 2016-09-11 DIAGNOSIS — N2581 Secondary hyperparathyroidism of renal origin: Secondary | ICD-10-CM | POA: Diagnosis not present

## 2016-09-11 DIAGNOSIS — D631 Anemia in chronic kidney disease: Secondary | ICD-10-CM | POA: Diagnosis not present

## 2016-09-11 DIAGNOSIS — I129 Hypertensive chronic kidney disease with stage 1 through stage 4 chronic kidney disease, or unspecified chronic kidney disease: Secondary | ICD-10-CM | POA: Diagnosis not present

## 2016-09-11 DIAGNOSIS — N184 Chronic kidney disease, stage 4 (severe): Secondary | ICD-10-CM | POA: Diagnosis not present

## 2016-09-27 ENCOUNTER — Telehealth: Payer: Self-pay | Admitting: Internal Medicine

## 2016-09-27 DIAGNOSIS — M25559 Pain in unspecified hip: Principal | ICD-10-CM

## 2016-09-27 DIAGNOSIS — M545 Low back pain: Secondary | ICD-10-CM

## 2016-09-27 DIAGNOSIS — G8929 Other chronic pain: Secondary | ICD-10-CM

## 2016-09-27 MED ORDER — DICYCLOMINE HCL 10 MG PO CAPS: 10.0000 mg | ORAL_CAPSULE | Freq: Every day | ORAL | 0 refills | Status: DC | PRN

## 2016-09-27 NOTE — Telephone Encounter (Signed)
Please advise 

## 2016-09-27 NOTE — Telephone Encounter (Signed)
Medication refilled

## 2016-09-27 NOTE — Addendum Note (Signed)
Addended by: Mauricio Po D on: 09/27/2016 08:49 PM   Modules accepted: Orders
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
## 2016-09-28 ENCOUNTER — Other Ambulatory Visit: Payer: Self-pay | Admitting: Internal Medicine

## 2016-09-28 MED ORDER — TIZANIDINE HCL 2 MG PO TABS: 2.0000 mg | ORAL_TABLET | Freq: Three times a day (TID) | ORAL | 0 refills | Status: DC | PRN

## 2016-09-28 NOTE — Telephone Encounter (Signed)
Pt called regarding her med refill she gave me the wrong medication she meant tiZANidine (ZANAFLEX) 2 MG tablet  Please advise she would like this refilled, she has plenty o the  Dicyclomine [facility] [place]

## 2016-09-28 NOTE — Addendum Note (Signed)
Addended by: Mauricio Po D on: 09/28/2016 02:07 PM   Modules accepted: Orders

## 2016-09-28 NOTE — Telephone Encounter (Signed)
Zanaflex sent to pharmacy  

## 2016-09-28 NOTE — Telephone Encounter (Signed)
Please advise, Bentyl was already sent in. Not sure if that matters

## 2016-09-30 ENCOUNTER — Encounter: Payer: Self-pay | Admitting: Family Medicine

## 2016-09-30 ENCOUNTER — Ambulatory Visit (INDEPENDENT_AMBULATORY_CARE_PROVIDER_SITE_OTHER): Payer: Medicare Other | Admitting: Family Medicine

## 2016-09-30 VITALS — BP 134/74 | HR 52 | Temp 97.9°F | Ht 64.0 in | Wt 166.5 lb

## 2016-09-30 DIAGNOSIS — M5441 Lumbago with sciatica, right side: Secondary | ICD-10-CM | POA: Diagnosis not present

## 2016-09-30 DIAGNOSIS — S139XXA Sprain of joints and ligaments of unspecified parts of neck, initial encounter: Secondary | ICD-10-CM | POA: Diagnosis not present
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
## 2016-09-30 NOTE — Assessment & Plan Note (Signed)
fizanidine000 ccan take 2 if needed Ultram and f/u pcp if no improvement

## 2016-09-30 NOTE — Patient Instructions (Signed)

## 2016-09-30 NOTE — Assessment & Plan Note (Signed)
Muscle relaxer---- she can take 2 if needed Ultram prn F/u pcp next week if no better

## 2016-10-03 ENCOUNTER — Other Ambulatory Visit: Payer: Self-pay | Admitting: Cardiology

## 2016-10-17 ENCOUNTER — Ambulatory Visit (INDEPENDENT_AMBULATORY_CARE_PROVIDER_SITE_OTHER): Payer: Medicare Other | Admitting: Family Medicine

## 2016-10-17 ENCOUNTER — Encounter: Payer: Self-pay | Admitting: Family Medicine

## 2016-10-17 ENCOUNTER — Ambulatory Visit (INDEPENDENT_AMBULATORY_CARE_PROVIDER_SITE_OTHER)
Admission: RE | Admit: 2016-10-17 | Discharge: 2016-10-17 | Disposition: A | Discharge status: Home / Self Care | Payer: Medicare Other | Source: Ambulatory Visit | Attending: Family Medicine | Admitting: Family Medicine

## 2016-10-17 VITALS — BP 140/82 | HR 59 | Temp 97.7°F | Ht 64.0 in | Wt 161.0 lb

## 2016-10-17 DIAGNOSIS — M5441 Lumbago with sciatica, right side: Secondary | ICD-10-CM

## 2016-10-17 DIAGNOSIS — M542 Cervicalgia: Secondary | ICD-10-CM

## 2016-10-17 DIAGNOSIS — S199XXA Unspecified injury of neck, initial encounter: Secondary | ICD-10-CM | POA: Diagnosis not present

## 2016-10-17 DIAGNOSIS — S139XXA Sprain of joints and ligaments of unspecified parts of neck, initial encounter: Secondary | ICD-10-CM

## 2016-10-17 DIAGNOSIS — M546 Pain in thoracic spine: Secondary | ICD-10-CM

## 2016-10-17 DIAGNOSIS — M545 Low back pain: Secondary | ICD-10-CM | POA: Diagnosis not present

## 2016-10-17 MED ORDER — PREDNISONE 5 MG (21) PO TBPK: ORAL_TABLET | ORAL | 0 refills | Status: DC

## 2016-10-17 NOTE — Assessment & Plan Note (Signed)
She will have a flare for sciatica but seems to have underlying muscular problems. - Lumbar films - Can stop the muscle relaxer. Provided pennsaid today  - Prednisone sent in - Referral to physical therapy - Advised to follow-up in a few weeks. If no improvement may need to consider an MRI.

## 2016-10-17 NOTE — Patient Instructions (Addendum)
Thank you for coming in,   We will call with the results from the x-rays today. Please follow-up with me in 2 weeks if he had not had any improvement. I have made a referral to physical therapy.   Please feel free to call with any questions or concerns at any time, at [phone]. --Dr. Raeford Razor
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
## 2016-10-17 NOTE — Assessment & Plan Note (Signed)
Has improved since she was last seen. - Cervical films

## 2016-10-17 NOTE — Assessment & Plan Note (Signed)
Having midline thoracic tenderness. His concern for compression fracture. - Thoracic x-rays today.

## 2016-10-27 ENCOUNTER — Other Ambulatory Visit: Payer: Self-pay | Admitting: Family

## 2016-10-27 DIAGNOSIS — M545 Low back pain, unspecified: Secondary | ICD-10-CM

## 2016-10-27 DIAGNOSIS — M25559 Pain in unspecified hip: Principal | ICD-10-CM

## 2016-10-27 DIAGNOSIS — G8929 Other chronic pain: Secondary | ICD-10-CM

## 2016-10-31 DIAGNOSIS — M545 Low back pain: Secondary | ICD-10-CM | POA: Diagnosis not present

## 2016-10-31 DIAGNOSIS — M546 Pain in thoracic spine: Secondary | ICD-10-CM | POA: Diagnosis not present

## 2016-10-31 DIAGNOSIS — M542 Cervicalgia: Secondary | ICD-10-CM | POA: Diagnosis not present

## 2016-11-01 DIAGNOSIS — M545 Low back pain: Secondary | ICD-10-CM | POA: Diagnosis not present

## 2016-11-02 ENCOUNTER — Ambulatory Visit: Payer: Medicare Other | Admitting: Family Medicine

## 2016-11-07 DIAGNOSIS — M4316 Spondylolisthesis, lumbar region: Secondary | ICD-10-CM | POA: Diagnosis not present

## 2016-11-07 DIAGNOSIS — S335XXA Sprain of ligaments of lumbar spine, initial encounter: Secondary | ICD-10-CM | POA: Diagnosis not present

## 2016-11-07 DIAGNOSIS — M5137 Other intervertebral disc degeneration, lumbosacral region: Secondary | ICD-10-CM | POA: Diagnosis not present

## 2016-11-07 DIAGNOSIS — M545 Low back pain: Secondary | ICD-10-CM | POA: Diagnosis not present

## 2016-11-07 DIAGNOSIS — M7061 Trochanteric bursitis, right hip: Secondary | ICD-10-CM | POA: Diagnosis not present

## 2016-11-23 DIAGNOSIS — S134XXA Sprain of ligaments of cervical spine, initial encounter: Secondary | ICD-10-CM | POA: Diagnosis not present

## 2016-11-23 DIAGNOSIS — M5137 Other intervertebral disc degeneration, lumbosacral region: Secondary | ICD-10-CM | POA: Diagnosis not present

## 2016-11-23 DIAGNOSIS — M4316 Spondylolisthesis, lumbar region: Secondary | ICD-10-CM | POA: Diagnosis not present

## 2016-12-05 ENCOUNTER — Other Ambulatory Visit: Payer: Self-pay

## 2016-12-07 NOTE — Progress Notes (Signed)
Patient ID: Vanessa [name], female   DOB: 01/30/1928, 83 y.o.   MRN: [id number] Should not be due for refill until October as 3 month supply sent in July.

## 2016-12-07 NOTE — Progress Notes (Signed)
Pls advise on refill.../lmb 

## 2017-01-03 ENCOUNTER — Encounter: Payer: Self-pay | Admitting: Internal Medicine

## 2017-01-03 ENCOUNTER — Ambulatory Visit (INDEPENDENT_AMBULATORY_CARE_PROVIDER_SITE_OTHER): Payer: Medicare Other | Admitting: Internal Medicine

## 2017-01-03 VITALS — BP 138/80 | HR 58 | Temp 97.5°F | Ht 64.0 in | Wt 158.0 lb

## 2017-01-03 DIAGNOSIS — M7062 Trochanteric bursitis, left hip: Secondary | ICD-10-CM

## 2017-01-03 DIAGNOSIS — M7061 Trochanteric bursitis, right hip: Secondary | ICD-10-CM | POA: Diagnosis not present

## 2017-01-03 DIAGNOSIS — M5441 Lumbago with sciatica, right side: Secondary | ICD-10-CM | POA: Diagnosis not present

## 2017-01-03 DIAGNOSIS — M25559 Pain in unspecified hip: Secondary | ICD-10-CM

## 2017-01-03 DIAGNOSIS — G8929 Other chronic pain: Secondary | ICD-10-CM | POA: Diagnosis not present

## 2017-01-03 DIAGNOSIS — M545 Low back pain: Secondary | ICD-10-CM

## 2017-01-03 DIAGNOSIS — Z23 Encounter for immunization: Secondary | ICD-10-CM

## 2017-01-03 DIAGNOSIS — M546 Pain in thoracic spine: Secondary | ICD-10-CM | POA: Diagnosis not present

## 2017-01-03 DIAGNOSIS — M542 Cervicalgia: Secondary | ICD-10-CM | POA: Diagnosis not present

## 2017-01-03 MED ORDER — DICYCLOMINE HCL 10 MG PO CAPS: 10.0000 mg | ORAL_CAPSULE | Freq: Every day | ORAL | 0 refills | Status: DC | PRN

## 2017-01-03 MED ORDER — TIZANIDINE HCL 2 MG PO TABS: 2.0000 mg | ORAL_TABLET | Freq: Three times a day (TID) | ORAL | 0 refills | Status: AC | PRN

## 2017-01-03 MED ORDER — PREDNISONE 5 MG (21) PO TBPK: ORAL_TABLET | ORAL | 0 refills | Status: DC

## 2017-01-03 MED ORDER — AMLODIPINE BESYLATE 2.5 MG PO TABS: 2.5000 mg | ORAL_TABLET | Freq: Every day | ORAL | 0 refills | Status: DC

## 2017-01-03 NOTE — Patient Instructions (Signed)
We have sent in the refill of the prednisone for the hips. We have also sent in the other refills for you.

## 2017-01-04 ENCOUNTER — Other Ambulatory Visit: Payer: Self-pay | Admitting: Family

## 2017-01-04 NOTE — Assessment & Plan Note (Signed)
Refill tizanidine.

## 2017-01-04 NOTE — Assessment & Plan Note (Signed)
Refill prednisone. Given stretching exercises to help prevent recurrence.

## 2017-01-04 NOTE — Progress Notes (Signed)
   Subjective:    Patient ID: Vanessa [name], female    DOB: 1927/11/13, 83 y.o.   MRN: [id number]  HPI The patient is an 83 YO female coming in for trochanteric bursitis in her right hip. She does get this from time to time. Generally she has been put on prednisone which has helped her symptoms. She denies injury or overuse. She is not able to walk well lately. She denies fevers or chills.   Review of Systems  Constitutional: Positive for activity change. Negative for appetite change, chills, fever and unexpected weight change.  Respiratory: Negative.   Cardiovascular: Negative.   Gastrointestinal: Negative.   Musculoskeletal: Positive for arthralgias, back pain, gait problem and myalgias. Negative for joint swelling, neck pain and neck stiffness.  Skin: Negative.   Neurological: Negative for dizziness, tremors, seizures, facial asymmetry, speech difficulty, weakness and numbness.      Objective:   Physical Exam  Constitutional: She is oriented to person, place, and time. She appears well-developed and well-nourished.  HENT:  Head: Normocephalic and atraumatic.  Eyes: EOM are normal.  Neck: Normal range of motion.  Cardiovascular: Normal rate and regular rhythm.   Pulmonary/Chest: Effort normal and breath sounds normal. No respiratory distress. She has no wheezes. She has no rales.  Musculoskeletal: She exhibits tenderness. She exhibits no edema.  Pain over the right trochanteric bursa  Neurological: She is alert and oriented to person, place, and time. Coordination normal.  Skin: Skin is warm and dry.   Vitals:   01/03/17 1043  BP: 138/80  Pulse: (!) 58  Temp: (!) 97.5 F (36.4 C)  TempSrc: Oral  SpO2: 98%  Weight: 158 lb (71.7 kg)  Height: 5\' 4"  (1.626 m)      Assessment & Plan:  Prevnar 13 given at visit

## 2017-01-05 ENCOUNTER — Other Ambulatory Visit: Payer: Self-pay

## 2017-01-05 MED ORDER — ESTRADIOL 0.5 MG PO TABS: ORAL_TABLET | ORAL | 0 refills | Status: DC

## 2017-01-29 DIAGNOSIS — H26493 Other secondary cataract, bilateral: Secondary | ICD-10-CM | POA: Diagnosis not present

## 2017-02-02 ENCOUNTER — Encounter: Payer: Self-pay | Admitting: Cardiology

## 2017-02-02 ENCOUNTER — Ambulatory Visit: Payer: Medicare Other | Admitting: Cardiology

## 2017-02-02 VITALS — HR 60 | Ht 64.0 in | Wt 157.0 lb

## 2017-02-02 DIAGNOSIS — I779 Disorder of arteries and arterioles, unspecified: Secondary | ICD-10-CM | POA: Diagnosis not present

## 2017-02-02 DIAGNOSIS — I10 Essential (primary) hypertension: Secondary | ICD-10-CM

## 2017-02-02 DIAGNOSIS — I2583 Coronary atherosclerosis due to lipid rich plaque: Secondary | ICD-10-CM

## 2017-02-02 DIAGNOSIS — R001 Bradycardia, unspecified: Secondary | ICD-10-CM | POA: Diagnosis not present

## 2017-02-02 DIAGNOSIS — I2510 Atherosclerotic heart disease of native coronary artery without angina pectoris: Secondary | ICD-10-CM | POA: Diagnosis not present

## 2017-02-02 DIAGNOSIS — I739 Peripheral vascular disease, unspecified: Secondary | ICD-10-CM

## 2017-02-02 NOTE — Progress Notes (Signed)
Patient ID: Vanessa [name], female   DOB: [birth year], 83 y.o.   MRN: [id number]      Cardiology Office Note   Date:  02/02/2017   ID:  Vanessa [name], DOB Mar 05, 1928, MRN [id number]  PCP:  Hoyt Koch, MD  Cardiologist:  Ena Dawley, MD   Follow up for bradycardia and coronary artery disease.  History of Present Illness: Vanessa [name] is a 83 y.o. female who presents today to follow-up coronary disease and bradycardia. I saw her last March, 2016. I referred her to Dr. Rayann Heman to help with the assessment of bradycardia. He noted that she needed further assessment of her thyroid. She was assessed very nicely by Dr. Dwyane Dee who put her on a very small dose of thyroid replacement. She is tolerating this and the dose will be increased over time. Her heart rate continues in the range of 45-50. She is feeling better. Originally she had exertional shortness of breath. I was concerned that this was from chronotropic incompetence. She is feeling better. The ultimate plan will be to follow her heart rate when her thyroid is completely normalized.  02/24/2015 - this is a 6 months follow-up for coronary artery disease in sinus bradycardia. Patient denies any symptoms of chest pain, shortness of breath, lower extremity edema orthopnea or proximal nocturnal dyspnea. She was seen by Dr. Rayann Heman who stated that at this time she doesn't qualify for pacemaker. She states she doesn't feel any dizziness and denies any presyncope or syncope.She didn't need to use any sublingual nitroglycerin.  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 She has noticed fatigue, no dizziness and held amlodipine in the last week with improvement of symptoms. No syncope, no LE edema, orthopnea.   02/02/2017 - this is one year follow-up, the patient states that she has been doing great, she continues to  go to the [facility] to do water aerobics and feels great she denies any chest pain shortness of breath no palpitations or lower extremity edema orthopnea or proximal nocturnal dyspnea. She still is the primary caregiver for a gentleman and doing all of the household 2 days with no symptoms. She is hoping that she'll be able to get an apartment near to her daughter in [place] with her next few months.  Past Medical History:  Diagnosis Date  . Bradycardia 05/2010   March, 2012  . CAD (coronary artery disease) 03/2007   DES to LAD, January, 2009 / stress echo, July, 2009 elsewhere, no ischemia  . Cancer ([place])    skin  . Chronic kidney disease    stage IV  . Dyslipidemia   . Dyslipidemia   . Hemorrhoids 1981  . History of shingles   . HTN (hypertension)   . Multiple thyroid nodules    small heterogenious nodules...OK  . Preop cardiovascular exam    Cardiac clearance for shoulder surgery October, 2013  . Retroperitoneal bleed    post cath 2004-surgery  . Shingles 07/19/2010  . Shoulder injury 2013   right   . Shoulder injury   . SOB (shortness of breath) 05/2010   March, 2012  . Statin intolerance   . Statin intolerance   . Thyroid nodule   . Wrist injury    plate placed [id number] in rt wrist & removed 2008  . Wrist injury     Past  Surgical History:  Procedure Laterality Date  . ABDOMINAL HYSTERECTOMY  1974  . APPENDECTOMY  1949  . cataracts     lazer sx 2011 & 2012  . EYE SURGERY  2014   eye lid sx  . TIBIA FRACTURE SURGERY  1995   rod placed   . TONSILLECTOMY  1956  . WRIST FUSION WITH ILIAC CREST BONE GRAFT  2005    Patient Active Problem List   Diagnosis Date Noted  . Acute midline thoracic back pain 10/17/2016  . Cervical sprain, initial encounter 09/30/2016  . RUQ pain 08/11/2016  . Chronic bilateral low back pain without sciatica 07/06/2016  . Pain in joint, lower leg 03/31/2016  . Pertussis 03/15/2016  . Trochanteric bursitis of both hips 01/28/2016  . Routine  general medical examination at a health care facility 09/03/2015  . Hypothyroid 06/29/2014  . CKD (chronic kidney disease) stage 4, GFR 15-29 ml/min ([facility]) 04/13/2014  . Carotid artery disease without cerebral infarction ([place]) 06/13/2013  . HTN (hypertension)   . Dyslipidemia   . Bradycardia 05/19/2010  . CAD S/P LAD DES '09. Myoview low risk 04/12/14 03/21/2007      Current Outpatient Medications  Medication Sig Dispense Refill  . amLODipine (NORVASC) 2.5 MG tablet Take 1 tablet (2.5 mg total) by mouth daily. 90 tablet 0  . aspirin 81 MG tablet Take 81 mg by mouth daily.     . Cholecalciferol (VITAMIN D3) 1000 units CAPS Take by mouth.    . estradiol (ESTRACE) 0.5 MG tablet TAKE 1 TABLET BY MOUTH  EVERY MONDAY, WEDNESDAY,  AND FRIDAY 39 tablet 0  . Magnesium 100 MG CAPS Take 1 capsule by mouth daily.    . nitroGLYCERIN (NITROSTAT) 0.4 MG SL tablet Place 1 tablet (0.4 mg total) under the tongue every 5 (five) minutes x 3 doses as needed for chest pain. 25 tablet 2  . tiZANidine (ZANAFLEX) 2 MG tablet Take 1 tablet (2 mg total) by mouth every 8 (eight) hours as needed for muscle spasms. (Patient not taking: Reported on 02/02/2017) 90 tablet 0   No current facility-administered medications for this visit.     Allergies:   Bystolic [nebivolol hcl]; Ciprofloxacin; Clindamycin; Cephalexin; Codeine; Hydrocodone-acetaminophen; Oxycodone hcl; Propoxyphene hcl; Propoxyphene n-acetaminophen; Statins; Tetanus toxoid; Tramadol; Levothyroxine sodium; Nitrofuran derivatives; Penicillins; and Tetracycline    Social History:  The patient  reports that  has never smoked. she has never used smokeless tobacco. She reports that she does not drink alcohol or use drugs.   Family History:  The patient's family history includes Cancer in her unknown relative; Coronary artery disease in her unknown relative; Heart attack in her mother; Pneumonia in her father.    ROS:  Please see the history of present illness.     Patient denies fever, chills, headache, sweats, rash, change in vision, change in hearing, chest pain, cough, nausea or vomiting, urinary symptoms. All other systems are reviewed and are negative.    PHYSICAL EXAM: VS:  Pulse 60   Ht 5\' 4"  (1.626 m)   Wt 157 lb (71.2 kg)   BMI 26.95 kg/m  , Patient is oriented to person time and place. Affect is normal. Head is atraumatic. Sclera and conjunctiva are normal. There is no jugular venous distention. Lungs are clear. Respiratory effort is nonlabored. Cardiac exam reveals S1 and S2. The abdomen is soft. There is no peripheral edema. There are no musculoskeletal deformities. There are no skin rashes.  EKG:   EKG is not done  today.   Recent Labs: 08/09/2016: ALT 11; BUN 17; Creatinine, Ser 1.45; Hemoglobin 14.7; Platelets 264.0; Potassium 4.4; Sodium 141; TSH 5.54    Lipid Panel    Component Value Date/Time   CHOL 242 (H) 08/09/2016 1112   TRIG 139.0 08/09/2016 1112   HDL 47.50 08/09/2016 1112   CHOLHDL 5 08/09/2016 1112   VLDL 27.8 08/09/2016 1112   LDLCALC 167 (H) 08/09/2016 1112      Wt Readings from Last 3 Encounters:  02/02/17 157 lb (71.2 kg)  01/03/17 158 lb (71.7 kg)  10/17/16 161 lb (73 kg)    EKG on 02/24/2015 - sinus vertical cardia with ventricular rate 55 bpm, negative T waves in the inferior and anterolateral leads that are unchanged from 06/08/2014.  TTE: 04/13/2014 - Left ventricle: The cavity size was normal. Wall thickness was normal. The estimated ejection fraction was 60%. Wall motion was normal; there were no regional wall motion abnormalities. - Left atrium: The atrium was mildly dilated. - Right ventricle: The cavity size was normal. Systolic function was normal. - Pulmonary arteries: PA peak pressure: 40 mm Hg (S).  EKG performed today 02/02/2017 was personally reviewed shows normal sinus rhythm and negative T waves in the inferolateral leads unchanged from prior.  ASSESSMENT AND PLAN:  1.  Bradycardia  - remains asymptomatic, recently seen by Dr. Rayann Heman and doesn't require a pacemaker placed. Her heart rate is currently 60 inches completely asymptomatic no further workup.  2. CAD - asymptomatic, the last nuclear study was January, 2016. Unchanged EKG since then as described above. She is completely supplying no ischemic workup is needed.  3. Hypothyroidism - resolved.  4. Hyperlipidemia - she didn't tolerate multiple statins, she is 83 year old very active very thin exercising several times a week, I will hold off of statins.  Follow-up in 6 months.Ena Dawley 02/02/2017

## 2017-02-02 NOTE — Patient Instructions (Signed)

## 2017-02-21 ENCOUNTER — Other Ambulatory Visit: Payer: Self-pay | Admitting: Internal Medicine

## 2017-03-01 ENCOUNTER — Telehealth: Payer: Self-pay | Admitting: Internal Medicine

## 2017-03-01 ENCOUNTER — Ambulatory Visit (INDEPENDENT_AMBULATORY_CARE_PROVIDER_SITE_OTHER): Payer: Medicare Other | Admitting: Internal Medicine

## 2017-03-01 ENCOUNTER — Encounter: Payer: Self-pay | Admitting: Internal Medicine

## 2017-03-01 DIAGNOSIS — M7061 Trochanteric bursitis, right hip: Secondary | ICD-10-CM | POA: Diagnosis not present

## 2017-03-01 DIAGNOSIS — M7062 Trochanteric bursitis, left hip: Secondary | ICD-10-CM | POA: Diagnosis not present

## 2017-03-01 MED ORDER — PREDNISONE 20 MG PO TABS: 40.0000 mg | ORAL_TABLET | Freq: Every day | ORAL | 0 refills | Status: AC

## 2017-03-01 MED ORDER — LIDOCAINE 5 % EX PTCH: 1.0000 | MEDICATED_PATCH | CUTANEOUS | 11 refills | Status: AC

## 2017-03-01 NOTE — Patient Instructions (Addendum)
We have sent in the lidocaine patches for the back. Try to fill it at the pharmacy and we can do a prior authorization if needed.   We have sent in the prednisone to take 2 pills daily for the next week to help with the pain.

## 2017-03-01 NOTE — Progress Notes (Signed)
   Subjective:    Patient ID: Vanessa [name], female    DOB: [birth year], 83 y.o.   MRN: [id number]  HPI The patient is an 83 YO female coming in for hip pain for several weeks. She does have some lower back problems which are causing her hip pain. They did offer her an injection but she was concerned about possible side effects. Due to her CKD she cannot take NSAIDS. Allergic to hydrocodone, tramadol, codeine. She is taking tylenol without relief. OTC products with lidocaine help some. She has taken prednisone in the past with good relief but she tries to take that rarely. Saw PT once and tries to do the exercises they showed her.   Review of Systems  Constitutional: Positive for activity change. Negative for appetite change, chills, fatigue, fever and unexpected weight change.  Respiratory: Negative.   Cardiovascular: Negative.   Gastrointestinal: Negative.   Musculoskeletal: Positive for arthralgias, back pain and myalgias. Negative for gait problem and joint swelling.  Skin: Negative.   Neurological: Negative.       Objective:   Physical Exam  Constitutional: She is oriented to person, place, and time. She appears well-developed and well-nourished.  HENT:  Head: Normocephalic and atraumatic.  Eyes: EOM are normal.  Neck: Normal range of motion.  Cardiovascular: Normal rate and regular rhythm.  Pulmonary/Chest: Effort normal and breath sounds normal. No respiratory distress. She has no wheezes. She has no rales.  Abdominal: Soft. Bowel sounds are normal. She exhibits no distension. There is no tenderness. There is no rebound.  Musculoskeletal: She exhibits tenderness. She exhibits no edema.  Neurological: She is alert and oriented to person, place, and time. Coordination abnormal.  Slow gait  Skin: Skin is warm and dry.   Vitals:   03/01/17 1549  BP: 122/78  Pulse: 60  Temp: 97.6 F (36.4 C)  TempSrc: Oral  SpO2: 98%  Weight: 158 lb (71.7 kg)  Height: 5\' 4"  (1.626 m)     Assessment & Plan:

## 2017-03-01 NOTE — Telephone Encounter (Signed)
Copied from [place] [id number]. Topic: Quick Communication - See Telephone Encounter >> Mar 01, 2017 10:56 AM Aurelio Brash B wrote: CRM for notification. See Telephone encounter for: Pt is requesting a rx for prednisone  be sent to [facility] on [address] for hip pain 36/64/40.

## 2017-03-01 NOTE — Telephone Encounter (Signed)
We cannot send in prednisone for hip pain without a visit and this medication is not meant to be repeated that often so we may recommend she see sports medicine for other treatment.

## 2017-03-01 NOTE — Telephone Encounter (Signed)
Notified pt w/MD response. Made appt for this afternoon @ 3:45...Vanessa [name]

## 2017-03-01 NOTE — Assessment & Plan Note (Signed)
Rx for prednisone, rx for lidoderm patches. She is unable to take NSAIDs at all due to CKD. She is allergic to codeine, tramadol, hydrocodone and oxycodone. She has tried and failed tylenol over the counter. Lidoderm is the safest and only option available to her for pain.

## 2017-03-02 DIAGNOSIS — H524 Presbyopia: Secondary | ICD-10-CM | POA: Diagnosis not present

## 2017-03-07 ENCOUNTER — Telehealth: Payer: Self-pay | Admitting: Internal Medicine

## 2017-03-07 NOTE — Telephone Encounter (Signed)
Copied from [place]. Topic: Quick Communication - See Telephone Encounter >> Mar 07, 2017  8:07 AM Ether Griffins B wrote: CRM for notification. See Telephone encounter for:  [facility] [place] [facility] on [facility]ocaine patches  03/07/17.

## 2017-03-07 NOTE — Telephone Encounter (Signed)
PA started on CoverMyMeds KEY: [id number]

## 2017-03-09 NOTE — Telephone Encounter (Signed)
PA was denied

## 2017-04-25 ENCOUNTER — Other Ambulatory Visit: Payer: Self-pay | Admitting: Internal Medicine

## 2017-06-04 ENCOUNTER — Other Ambulatory Visit: Payer: Self-pay | Admitting: Internal Medicine

## 2017-06-07 ENCOUNTER — Telehealth: Payer: Self-pay

## 2017-06-07 ENCOUNTER — Other Ambulatory Visit: Payer: Self-pay | Admitting: Internal Medicine

## 2017-06-07 NOTE — Telephone Encounter (Signed)
Refill request for Estrace 0.5 mg tab from [facility].  Please advise in Dr. Nathanial Millman absence.  Thanks, [employer]

## 2017-06-08 ENCOUNTER — Other Ambulatory Visit: Payer: Self-pay | Admitting: Internal Medicine

## 2017-06-08 DIAGNOSIS — N959 Unspecified menopausal and perimenopausal disorder: Secondary | ICD-10-CM

## 2017-06-08 MED ORDER — ESTRADIOL 0.5 MG PO TABS
0.5000 mg | ORAL_TABLET | ORAL | 0 refills | Status: AC
Start: 2017-06-08 — End: ?
